# Patient Record
Sex: Male | Born: 1980 | Race: White | Hispanic: No | Marital: Single | Smoking: Current some day smoker
Health system: Southern US, Community
[De-identification: ages and names within clinical notes are randomized; demographics above are authoritative.]

## PROBLEM LIST (undated history)

## (undated) DIAGNOSIS — M1990 Unspecified osteoarthritis, unspecified site: Secondary | ICD-10-CM

## (undated) DIAGNOSIS — I10 Essential (primary) hypertension: Secondary | ICD-10-CM

## (undated) DIAGNOSIS — E785 Hyperlipidemia, unspecified: Secondary | ICD-10-CM

## (undated) DIAGNOSIS — K219 Gastro-esophageal reflux disease without esophagitis: Secondary | ICD-10-CM

## (undated) HISTORY — DX: Hyperlipidemia, unspecified: E78.5

## (undated) HISTORY — DX: Gastro-esophageal reflux disease without esophagitis: K21.9

## (undated) HISTORY — PX: TONSILLECTOMY: SUR1361

## (undated) SURGERY — Surgical Case
Anesthesia: *Unknown

---

## 2013-09-15 ENCOUNTER — Ambulatory Visit: Payer: Self-pay

## 2013-09-15 LAB — CBC WITH DIFFERENTIAL/PLATELET
Basophil #: 0 10*3/uL (ref 0.0–0.1)
Basophil %: 0.4 %
EOS ABS: 0.1 10*3/uL (ref 0.0–0.7)
Eosinophil %: 1.2 %
HCT: 44.9 % (ref 40.0–52.0)
HGB: 14.9 g/dL (ref 13.0–18.0)
LYMPHS ABS: 2.1 10*3/uL (ref 1.0–3.6)
Lymphocyte %: 26.8 %
MCH: 28.3 pg (ref 26.0–34.0)
MCHC: 33.1 g/dL (ref 32.0–36.0)
MCV: 86 fL (ref 80–100)
MONOS PCT: 6.6 %
Monocyte #: 0.5 x10 3/mm (ref 0.2–1.0)
Neutrophil #: 5.2 10*3/uL (ref 1.4–6.5)
Neutrophil %: 65 %
Platelet: 285 10*3/uL (ref 150–440)
RBC: 5.25 10*6/uL (ref 4.40–5.90)
RDW: 13 % (ref 11.5–14.5)
WBC: 8 10*3/uL (ref 3.8–10.6)

## 2013-09-15 LAB — URINALYSIS, COMPLETE
BILIRUBIN, UR: NEGATIVE
Bacteria: NEGATIVE
GLUCOSE, UR: NEGATIVE mg/dL (ref 0–75)
Ketone: NEGATIVE
Leukocyte Esterase: NEGATIVE
Nitrite: NEGATIVE
PH: 6.5 (ref 4.5–8.0)
PROTEIN: NEGATIVE
SPECIFIC GRAVITY: 1.01 (ref 1.003–1.030)
SQUAMOUS EPITHELIAL: NONE SEEN
WBC UR: NONE SEEN /HPF (ref 0–5)

## 2013-09-15 LAB — BASIC METABOLIC PANEL
Anion Gap: 10 (ref 7–16)
BUN: 9 mg/dL (ref 7–18)
CALCIUM: 9.4 mg/dL (ref 8.5–10.1)
CHLORIDE: 101 mmol/L (ref 98–107)
CREATININE: 0.97 mg/dL (ref 0.60–1.30)
Co2: 30 mmol/L (ref 21–32)
EGFR (African American): >60
EGFR (Non-African Amer.): >60
Glucose: 98 mg/dL (ref 65–99)
Osmolality: 280 (ref 275–301)
Potassium: 3.7 mmol/L (ref 3.5–5.1)
Sodium: 141 mmol/L (ref 136–145)

## 2013-09-17 LAB — URINE CULTURE

## 2015-03-05 ENCOUNTER — Other Ambulatory Visit: Payer: Self-pay | Admitting: Student

## 2015-03-05 DIAGNOSIS — R9389 Abnormal findings on diagnostic imaging of other specified body structures: Secondary | ICD-10-CM

## 2015-03-05 DIAGNOSIS — R1084 Generalized abdominal pain: Secondary | ICD-10-CM

## 2015-03-12 ENCOUNTER — Ambulatory Visit
Admission: RE | Admit: 2015-03-12 | Discharge: 2015-03-12 | Disposition: A | Discharge status: Home / Self Care | Payer: 59 | Source: Ambulatory Visit | Attending: Student | Admitting: Student

## 2015-03-12 DIAGNOSIS — R1084 Generalized abdominal pain: Secondary | ICD-10-CM | POA: Diagnosis present

## 2015-03-12 DIAGNOSIS — R938 Abnormal findings on diagnostic imaging of other specified body structures: Secondary | ICD-10-CM | POA: Insufficient documentation

## 2015-03-12 DIAGNOSIS — R9389 Abnormal findings on diagnostic imaging of other specified body structures: Secondary | ICD-10-CM

## 2015-03-12 HISTORY — DX: Essential (primary) hypertension: I10

## 2015-03-12 MED ORDER — IOHEXOL 300 MG/ML  SOLN
100.0000 mL | Freq: Once | INTRAMUSCULAR | Status: AC | PRN
  Administered 2015-03-12: 100 mL via INTRAVENOUS

## 2015-03-18 ENCOUNTER — Encounter: Payer: Self-pay | Admitting: Urology

## 2015-03-18 ENCOUNTER — Ambulatory Visit (INDEPENDENT_AMBULATORY_CARE_PROVIDER_SITE_OTHER): Payer: 59 | Admitting: Urology

## 2015-03-18 VITALS — BP 127/78 | HR 71 | Ht 71.0 in | Wt 251.5 lb

## 2015-03-18 DIAGNOSIS — N410 Acute prostatitis: Secondary | ICD-10-CM | POA: Diagnosis not present

## 2015-03-18 DIAGNOSIS — R350 Frequency of micturition: Secondary | ICD-10-CM | POA: Diagnosis not present

## 2015-03-18 DIAGNOSIS — E785 Hyperlipidemia, unspecified: Secondary | ICD-10-CM | POA: Insufficient documentation

## 2015-03-18 LAB — URINALYSIS, COMPLETE
Bilirubin, UA: NEGATIVE
GLUCOSE, UA: NEGATIVE
KETONES UA: NEGATIVE
Leukocytes, UA: NEGATIVE
NITRITE UA: NEGATIVE
Protein, UA: NEGATIVE
SPEC GRAV UA: 1.01 (ref 1.005–1.030)
Urobilinogen, Ur: 0.2 mg/dL (ref 0.2–1.0)
pH, UA: 7 (ref 5.0–7.5)

## 2015-03-18 LAB — MICROSCOPIC EXAMINATION
Bacteria, UA: NONE SEEN
EPITHELIAL CELLS (NON RENAL): NONE SEEN /HPF (ref 0–10)
RBC MICROSCOPIC, UA: NONE SEEN /HPF (ref 0–?)
RENAL EPITHEL UA: NONE SEEN /HPF
WBC UA: NONE SEEN /HPF (ref 0–?)

## 2015-03-18 LAB — BLADDER SCAN AMB NON-IMAGING

## 2015-03-18 MED ORDER — CIPROFLOXACIN HCL 500 MG PO TABS: 500.0000 mg | ORAL_TABLET | Freq: Two times a day (BID) | ORAL | Status: DC

## 2015-03-18 NOTE — Progress Notes (Signed)
03/18/2015 11:03 AM   Mark Rollins 02-28-1981 [id number]  Referring provider: [name], MD [address] [facility] [place], [place] [address]  Chief Complaint  Patient presents with  . Urinary Frequency    New Patient    HPI: The patient is a 34 year old gentleman presents for urinary frequency. He notes significant frequency and urgency. He has nocturia 2. This has been going on for 6-12 months. He believes this started suddenly. He has never had an issue with this before. He has had multiple sexual partners in the last 6-12 months. His urinalysis from today's clean. He has not been treated for urinary tract infection   PMH: Past Medical History  Diagnosis Date  . Hypertension     Surgical History: No past surgical history on file.  Home Medications:    Medication List       This list is accurate as of: 03/18/15 11:03 AM.  Always use your most recent med list.               DHA-EPA-VITAMIN E PO  Take by mouth.     meloxicam 15 MG tablet  Commonly known as:  MOBIC  TAKE 1 TABLET (15 MG TOTAL) BY MOUTH ONCE DAILY.     multivitamin capsule  Take 1 capsule by mouth daily.     simvastatin 20 MG tablet  Commonly known as:  ZOCOR  TAKE 1 TABLET (20 MG TOTAL) BY MOUTH NIGHTLY.        Allergies: No Known Allergies  Family History: No family history on file.  Social History:  has no tobacco, alcohol, and drug history on file.  ROS:                                        Physical Exam: There were no vitals taken for this visit.  Constitutional:  Alert and oriented, No acute distress. HEENT: [place] AT, moist mucus membranes.  Trachea midline, no masses. Cardiovascular: No clubbing, cyanosis, or edema. Respiratory: Normal respiratory effort, no increased work of breathing. GI: Abdomen is soft, nontender, nondistended, no abdominal masses GU: No CVA tenderness. Normal phallus. Testicles descended equal bilaterally. No  masses. The patient is very tender on digital rectal exam. His prostate did seem somewhat boggy to me. Skin: No rashes, bruises or suspicious lesions. Lymph: No cervical or inguinal adenopathy. Neurologic: Grossly intact, no focal deficits, moving all 4 extremities. Psychiatric: Normal mood and affect.  Laboratory Data: Lab Results  Component Value Date   WBC 8.0 09/15/2013   HGB 14.9 09/15/2013   HCT 44.9 09/15/2013   MCV 86 09/15/2013   PLT 285 09/15/2013    Lab Results  Component Value Date   CREATININE 0.97 09/15/2013    No results found for: PSA  No results found for: TESTOSTERONE  No results found for: HGBA1C  Urinalysis    Component Value Date/Time   COLORURINE YELLOW 09/15/2013 1330   APPEARANCEUR CLEAR 09/15/2013 1330   LABSPEC 1.010 09/15/2013 1330   PHURINE 6.5 09/15/2013 1330   GLUCOSEU NEGATIVE 09/15/2013 1330   HGBUR 1+ 09/15/2013 1330   BILIRUBINUR NEGATIVE 09/15/2013 1330   KETONESUR NEGATIVE 09/15/2013 1330   PROTEINUR NEGATIVE 09/15/2013 1330   NITRITE NEGATIVE 09/15/2013 1330   LEUKOCYTESUR NEGATIVE 09/15/2013 1330    Pertinent Imaging: PVR: 25  cc  Assessment & Plan:    1. Prostatitis Times concerned the patient  may have prostatitis based on my exam. Though his urine is clear today on urinalysis. We will treat him prophylactically with a month's course of Cipro 500 twice a day. He will follow-up in 4-6 weeks. At that time, we will evaluate his symptoms. He may benefit from a cystoscopy at that time because he had a complaints of post void dribbling and some changes in his stream. He is encouraged to follow up sooner if his symptoms worsen. We will also send a urine culture, GC, chlamydia and Trichomonas   No Follow-up on file.  Hildred LaserBrian James Arlind Klingerman, MD  [facility] [address], [address] [place], [place] [address] [phone]

## 2015-03-19 LAB — CHLAMYDIA/GONOCOCCUS/TRICHOMONAS, NAA
CHLAMYDIA BY NAA: NEGATIVE
GONOCOCCUS BY NAA: NEGATIVE
Trich vag by NAA: NEGATIVE

## 2015-03-20 LAB — CULTURE, URINE COMPREHENSIVE

## 2015-04-22 ENCOUNTER — Ambulatory Visit (INDEPENDENT_AMBULATORY_CARE_PROVIDER_SITE_OTHER): Payer: 59 | Admitting: Urology

## 2015-04-22 ENCOUNTER — Encounter: Payer: Self-pay | Admitting: Urology

## 2015-04-22 VITALS — BP 113/67 | HR 63 | Ht 71.0 in | Wt 247.2 lb

## 2015-04-22 DIAGNOSIS — N411 Chronic prostatitis: Secondary | ICD-10-CM

## 2015-04-22 DIAGNOSIS — N3281 Overactive bladder: Secondary | ICD-10-CM | POA: Diagnosis not present

## 2015-04-22 LAB — URINALYSIS, COMPLETE
Bilirubin, UA: NEGATIVE
Glucose, UA: NEGATIVE
Ketones, UA: NEGATIVE
Leukocytes, UA: NEGATIVE
NITRITE UA: NEGATIVE
PH UA: 6.5 (ref 5.0–7.5)
Protein, UA: NEGATIVE
Specific Gravity, UA: 1.02 (ref 1.005–1.030)
UUROB: 0.2 mg/dL (ref 0.2–1.0)

## 2015-04-22 LAB — MICROSCOPIC EXAMINATION
BACTERIA UA: NONE SEEN
EPITHELIAL CELLS (NON RENAL): NONE SEEN /HPF (ref 0–10)

## 2015-04-22 MED ORDER — MIRABEGRON ER 25 MG PO TB24: 25.0000 mg | ORAL_TABLET | Freq: Every day | ORAL | Status: DC

## 2015-04-22 NOTE — Progress Notes (Signed)
04/22/2015 8:54 AM   Mark Rollins 1980/10/10 [id number]  Referring provider: [name], MD [address] [facility] [place], [place] [address]  Chief Complaint  Patient presents with  . Follow-up    prostatits    HPI: The patient is a 34 year old gentleman presents for urinary frequency. He notes significant frequency and urgency. He has nocturia 2. This has been going on for 6-12 months. He believes this started suddenly. He has never had an issue with this before. He has had multiple sexual partners in the last 6-12 months. His urinalysis from today's clean. He has not been treated for urinary tract infection  Interval history: The patient was started on ciprofloxacin last month. He was only minimal change in symptoms. This is nocturia 2-3. He also has frequency every 2 hours and urgency. He finds the urgency very bothersome.   PMH: Past Medical History  Diagnosis Date  . Hypertension   . Hyperlipemia   . GERD (gastroesophageal reflux disease)     Surgical History: Past Surgical History  Procedure Laterality Date  . Tonsillectomy      Home Medications:    Medication List       This list is accurate as of: 04/22/15  8:54 AM.  Always use your most recent med list.               ciprofloxacin 500 MG tablet  Commonly known as:  CIPRO  Take 1 tablet (500 mg total) by mouth every 12 (twelve) hours.     DHA-EPA-VITAMIN E PO  Take by mouth.     meloxicam 15 MG tablet  Commonly known as:  MOBIC  TAKE 1 TABLET (15 MG TOTAL) BY MOUTH ONCE DAILY.     mirabegron ER 25 MG Tb24 tablet  Commonly known as:  MYRBETRIQ  Take 1 tablet (25 mg total) by mouth daily.     multivitamin capsule  Take 1 capsule by mouth daily.     simvastatin 20 MG tablet  Commonly known as:  ZOCOR  TAKE 1 TABLET (20 MG TOTAL) BY MOUTH NIGHTLY.        Allergies: No Known Allergies  Family History: Family History  Problem Relation Age of Onset  . Prostate cancer Neg  Hx   . Kidney cancer Neg Hx   . Bladder Cancer Neg Hx     Social History:  reports that he has never smoked. He does not have any smokeless tobacco history on file. He reports that he drinks alcohol. He reports that he does not use illicit drugs.  ROS: UROLOGY Frequent Urination?: Yes Hard to postpone urination?: Yes Burning/pain with urination?: No Get up at night to urinate?: Yes Leakage of urine?: No Urine stream starts and stops?: No Trouble starting stream?: No Do you have to strain to urinate?: No Blood in urine?: Yes Urinary tract infection?: No Sexually transmitted disease?: No Injury to kidneys or bladder?: No Painful intercourse?: No Weak stream?: No Erection problems?: No Penile pain?: No  Gastrointestinal Nausea?: No Vomiting?: No Indigestion/heartburn?: Yes Diarrhea?: No Constipation?: No  Constitutional Fever: No Night sweats?: No Weight loss?: No Fatigue?: No  Skin Skin rash/lesions?: No Itching?: No  Eyes Blurred vision?: No Double vision?: No  Ears/Nose/Throat Sore throat?: No Sinus problems?: No  Hematologic/Lymphatic Swollen glands?: No Easy bruising?: No  Cardiovascular Leg swelling?: No Chest pain?: No  Respiratory Cough?: No Shortness of breath?: No  Endocrine Excessive thirst?: No  Musculoskeletal Back pain?: No Joint pain?: No  Neurological  Headaches?: No Dizziness?: No  Psychologic Depression?: No Anxiety?: No  Physical Exam: BP 113/67 mmHg  Pulse 63  Ht 5\' 11"  (1.803 m)  Wt 247 lb 3.2 oz (112.129 kg)  BMI 34.49 kg/m2  Constitutional:  Alert and oriented, No acute distress. HEENT: [place] AT, moist mucus membranes.  Trachea midline, no masses. Cardiovascular: No clubbing, cyanosis, or edema. Respiratory: Normal respiratory effort, no increased work of breathing. GI: Abdomen is soft, nontender, nondistended, no abdominal masses GU: No CVA tenderness. Skin: No rashes, bruises or suspicious lesions. Lymph: No  cervical or inguinal adenopathy. Neurologic: Grossly intact, no focal deficits, moving all 4 extremities. Psychiatric: Normal mood and affect.  Laboratory Data: Lab Results  Component Value Date   WBC 8.0 09/15/2013   HGB 14.9 09/15/2013   HCT 44.9 09/15/2013   MCV 86 09/15/2013   PLT 285 09/15/2013    Lab Results  Component Value Date   CREATININE 0.97 09/15/2013    No results found for: PSA  No results found for: TESTOSTERONE  No results found for: HGBA1C  Urinalysis    Component Value Date/Time   COLORURINE YELLOW 09/15/2013 1330   APPEARANCEUR CLEAR 09/15/2013 1330   LABSPEC 1.010 09/15/2013 1330   PHURINE 6.5 09/15/2013 1330   GLUCOSEU Negative 03/18/2015 1105   GLUCOSEU NEGATIVE 09/15/2013 1330   HGBUR 1+ 09/15/2013 1330   BILIRUBINUR Negative 03/18/2015 1105   BILIRUBINUR NEGATIVE 09/15/2013 1330   KETONESUR NEGATIVE 09/15/2013 1330   PROTEINUR NEGATIVE 09/15/2013 1330   NITRITE Negative 03/18/2015 1105   NITRITE NEGATIVE 09/15/2013 1330   LEUKOCYTESUR Negative 03/18/2015 1105   LEUKOCYTESUR NEGATIVE 09/15/2013 1330     Assessment & Plan:    The patient seems to have an overactive bladder. He failed treatment for chronic prostatitis and has not had any positive urine cultures. At this time, we will treat him for overactive bladder. I gave him samples of Myrbetriq 25 mg to take daily. I also called in a prescription to his pharmacy. If his insurance will not pay for this medication, we will switch him to an anticholinergic.   1. Overactive bladder -Myrbetriq 25 mg daily   Hildred LaserBrian James Iyah Laguna, MD  [facility] [address], [address] [place], [place] [address] [phone]

## 2015-05-25 ENCOUNTER — Telehealth: Payer: Self-pay

## 2015-05-25 NOTE — Telephone Encounter (Signed)
Pt insurance co will not pay for medication. Please advise.

## 2015-05-26 ENCOUNTER — Ambulatory Visit: Payer: 59

## 2015-05-27 NOTE — Telephone Encounter (Signed)
Can we order him vesicare 5 mg daily? thanks

## 2015-06-03 ENCOUNTER — Ambulatory Visit (INDEPENDENT_AMBULATORY_CARE_PROVIDER_SITE_OTHER): Payer: 59 | Admitting: Urology

## 2015-06-03 VITALS — Ht 71.0 in | Wt 243.4 lb

## 2015-06-03 DIAGNOSIS — N3281 Overactive bladder: Secondary | ICD-10-CM

## 2015-06-03 LAB — URINALYSIS, COMPLETE
BILIRUBIN UA: NEGATIVE
Glucose, UA: NEGATIVE
Ketones, UA: NEGATIVE
LEUKOCYTES UA: NEGATIVE
Nitrite, UA: NEGATIVE
PH UA: 7 (ref 5.0–7.5)
PROTEIN UA: NEGATIVE
Specific Gravity, UA: 1.015 (ref 1.005–1.030)
UUROB: 0.2 mg/dL (ref 0.2–1.0)

## 2015-06-03 LAB — MICROSCOPIC EXAMINATION: WBC UA: NONE SEEN /HPF (ref 0–?)

## 2015-06-03 NOTE — Progress Notes (Signed)
06/03/2015 8:56 AM   Mark Rollins 1981-03-23 [id number]  Referring provider: Jerl MinaJames Hedrick, MD [address] [facility] [place], [place] [address]  Chief Complaint  Patient presents with  . Follow-up    OAB, prostatitis    HPI: The patient is a 35 year old gentleman presents for urinary frequency. He notes significant frequency and urgency. He has nocturia 2. This has been going on for 6-12 months. He believes this started suddenly. He has never had an issue with this before. He has had multiple sexual partners in the last 6-12 months. His urinalysis from today's clean. He has not been treated for urinary tract infection  December 2016 Interval history: The patient was started on ciprofloxacin last month. He was only minimal change in symptoms. This is nocturia 2-3. He also has frequency every 2 hours and urgency. He finds the urgency very bothersome.     January 2016 Interval History: The patient is been very happy since starting Myrbetriq. He is able to sleep through the night without nocturia. He is gone from voiding approximately once every hour to once every 3-4 hours. His urgency is also resolved. His insurance would not cover this medication, so he was only taking it every other day to make his samples last. He says even take it every other day successfully manage his symptoms.  PMH: Past Medical History  Diagnosis Date  . Hypertension   . Hyperlipemia   . GERD (gastroesophageal reflux disease)     Surgical History: Past Surgical History  Procedure Laterality Date  . Tonsillectomy      Home Medications:    Medication List       This list is accurate as of: 06/03/15  8:56 AM.  Always use your most recent med list.               Fish Oil 1000 MG Caps  Take 1 capsule by mouth.     glucosamine-chondroitin 500-400 MG tablet  Take 1 tablet by mouth 3 (three) times daily.     mirabegron ER 25 MG Tb24 tablet  Commonly known as:  MYRBETRIQ  Take 1  tablet (25 mg total) by mouth daily.     multivitamin capsule  Take 1 capsule by mouth daily.     simvastatin 20 MG tablet  Commonly known as:  ZOCOR  TAKE 1 TABLET (20 MG TOTAL) BY MOUTH NIGHTLY.        Allergies: No Known Allergies  Family History: Family History  Problem Relation Age of Onset  . Prostate cancer Neg Hx   . Kidney cancer Neg Hx   . Bladder Cancer Neg Hx     Social History:  reports that he has never smoked. He does not have any smokeless tobacco history on file. He reports that he drinks alcohol. He reports that he does not use illicit drugs.  ROS: UROLOGY Frequent Urination?: Yes Hard to postpone urination?: Yes Burning/pain with urination?: No Get up at night to urinate?: Yes Leakage of urine?: No Urine stream starts and stops?: No Trouble starting stream?: No Do you have to strain to urinate?: No Blood in urine?: No Urinary tract infection?: No Sexually transmitted disease?: No Injury to kidneys or bladder?: No Painful intercourse?: No Weak stream?: No Erection problems?: No Penile pain?: No  Gastrointestinal Nausea?: No Vomiting?: No Indigestion/heartburn?: No Diarrhea?: No Constipation?: No  Constitutional Fever: No Night sweats?: No Weight loss?: No Fatigue?: No  Skin Skin rash/lesions?: No Itching?: No  Eyes Blurred vision?:  No Double vision?: No  Ears/Nose/Throat Sore throat?: No Sinus problems?: No  Hematologic/Lymphatic Swollen glands?: No Easy bruising?: No  Cardiovascular Leg swelling?: No Chest pain?: No  Respiratory Cough?: No Shortness of breath?: No  Endocrine Excessive thirst?: No  Musculoskeletal Back pain?: No Joint pain?: No  Neurological Headaches?: No Dizziness?: No  Psychologic Depression?: No Anxiety?: No  Physical Exam: Ht 5\' 11"  (1.803 m)  Wt 243 lb 6.4 oz (110.406 kg)  BMI 33.96 kg/m2  Constitutional:  Alert and oriented, No acute distress. HEENT: [place] AT, moist mucus  membranes.  Trachea midline, no masses. Cardiovascular: No clubbing, cyanosis, or edema. Respiratory: Normal respiratory effort, no increased work of breathing. GI: Abdomen is soft, nontender, nondistended, no abdominal masses GU: No CVA tenderness.  Skin: No rashes, bruises or suspicious lesions. Lymph: No cervical or inguinal adenopathy. Neurologic: Grossly intact, no focal deficits, moving all 4 extremities. Psychiatric: Normal mood and affect.  Laboratory Data: Lab Results  Component Value Date   WBC 8.0 09/15/2013   HGB 14.9 09/15/2013   HCT 44.9 09/15/2013   MCV 86 09/15/2013   PLT 285 09/15/2013    Lab Results  Component Value Date   CREATININE 0.97 09/15/2013    No results found for: PSA  No results found for: TESTOSTERONE  No results found for: HGBA1C  Urinalysis    Component Value Date/Time   COLORURINE YELLOW 09/15/2013 1330   APPEARANCEUR CLEAR 09/15/2013 1330   LABSPEC 1.010 09/15/2013 1330   PHURINE 6.5 09/15/2013 1330   GLUCOSEU Negative 04/22/2015 0834   GLUCOSEU NEGATIVE 09/15/2013 1330   HGBUR 1+ 09/15/2013 1330   BILIRUBINUR Negative 04/22/2015 0834   BILIRUBINUR NEGATIVE 09/15/2013 1330   KETONESUR NEGATIVE 09/15/2013 1330   PROTEINUR NEGATIVE 09/15/2013 1330   NITRITE Negative 04/22/2015 0834   NITRITE NEGATIVE 09/15/2013 1330   LEUKOCYTESUR Negative 04/22/2015 0834   LEUKOCYTESUR NEGATIVE 09/15/2013 1330     Assessment & Plan:    The patient is well controlled on Myrbetriq, however his insurance does not cover this medication. We've arranged with the drug rep for the patient to receive free medication. He will pick this up from our office when it is available. He was given a 28 day course of samples until that time.  1. Overactive bladder -Continue Myrbetriq 25 mg -Follow-up in 3 months   Return in about 3 months (around 09/01/2015).  Hildred Laser, MD  [facility] [address], [address] [place], [place] [address] [phone]

## 2015-09-01 ENCOUNTER — Encounter: Payer: Self-pay | Admitting: Urology

## 2015-09-01 ENCOUNTER — Ambulatory Visit (INDEPENDENT_AMBULATORY_CARE_PROVIDER_SITE_OTHER): Payer: 59 | Admitting: Urology

## 2015-09-01 VITALS — BP 121/79 | HR 61 | Ht 70.0 in | Wt 239.3 lb

## 2015-09-01 DIAGNOSIS — N3281 Overactive bladder: Secondary | ICD-10-CM

## 2015-09-01 LAB — URINALYSIS, COMPLETE
BILIRUBIN UA: NEGATIVE
GLUCOSE, UA: NEGATIVE
KETONES UA: NEGATIVE
Leukocytes, UA: NEGATIVE
Nitrite, UA: NEGATIVE
PH UA: 7 (ref 5.0–7.5)
PROTEIN UA: NEGATIVE
Specific Gravity, UA: 1.02 (ref 1.005–1.030)
UUROB: 0.2 mg/dL (ref 0.2–1.0)

## 2015-09-01 LAB — MICROSCOPIC EXAMINATION
BACTERIA UA: NONE SEEN
Epithelial Cells (non renal): NONE SEEN /hpf (ref 0–10)

## 2015-09-01 NOTE — Progress Notes (Signed)
09/01/2015 9:04 AM   Philipp Brannick 10-12-1980 [id number]  Referring provider: [name], MD [address] [facility] [place], [place] [address]  Chief Complaint  Patient presents with  . Follow-up    OAB    HPI: The patient is a 35 year old gentleman With a history of overactive bladder who is on Mybetriq who presents for follow up. Prior to starting treatment, the patient's severe urgency and frequency. He also nocturia 2. He was initially started on antibiotics due to concern for urinary tract infection. His symptoms did not resolve. He was started on Myrbetriq 25 mg daily. He decreased interval to once every other day or third day. Symptoms significantly improved. He is no as nocturia. His urgency has resolved.    PMH: Past Medical History  Diagnosis Date  . Hypertension   . Hyperlipemia   . GERD (gastroesophageal reflux disease)     Surgical History: Past Surgical History  Procedure Laterality Date  . Tonsillectomy      Home Medications:    Medication List       This list is accurate as of: 09/01/15  9:04 AM.  Always use your most recent med list.               Fish Oil 1000 MG Caps  Take 1 capsule by mouth.     OMEGA-3 FATTY ACIDS PO  Take by mouth. Reported on 09/01/2015     glucosamine-chondroitin 500-400 MG tablet  Take 1 tablet by mouth 3 (three) times daily.     mirabegron ER 25 MG Tb24 tablet  Commonly known as:  MYRBETRIQ  Take 1 tablet (25 mg total) by mouth daily.     multivitamin capsule  Take 1 capsule by mouth daily.     simvastatin 20 MG tablet  Commonly known as:  ZOCOR  TAKE 1 TABLET (20 MG TOTAL) BY MOUTH NIGHTLY.        Allergies: No Known Allergies  Family History: Family History  Problem Relation Age of Onset  . Prostate cancer Neg Hx   . Kidney cancer Neg Hx   . Bladder Cancer Neg Hx     Social History:  reports that he has never smoked. He does not have any smokeless tobacco history on file. He  reports that he drinks alcohol. He reports that he does not use illicit drugs.  ROS: UROLOGY Frequent Urination?: No Hard to postpone urination?: No Burning/pain with urination?: No Get up at night to urinate?: Yes Leakage of urine?: No Urine stream starts and stops?: No Trouble starting stream?: No Do you have to strain to urinate?: No Blood in urine?: No Urinary tract infection?: No Sexually transmitted disease?: No Injury to kidneys or bladder?: No Painful intercourse?: No Weak stream?: No Erection problems?: No Penile pain?: No  Gastrointestinal Nausea?: No Vomiting?: No Indigestion/heartburn?: No Diarrhea?: No Constipation?: No  Constitutional Fever: No Night sweats?: No Weight loss?: No Fatigue?: No  Skin Skin rash/lesions?: No Itching?: No  Eyes Blurred vision?: No Double vision?: No  Ears/Nose/Throat Sore throat?: No Sinus problems?: No  Hematologic/Lymphatic Swollen glands?: No Easy bruising?: No  Cardiovascular Leg swelling?: No Chest pain?: No  Respiratory Cough?: No Shortness of breath?: No  Endocrine Excessive thirst?: No  Musculoskeletal Back pain?: No Joint pain?: No  Neurological Headaches?: No Dizziness?: No  Psychologic Depression?: No Anxiety?: No  Physical Exam: BP 121/79 mmHg  Pulse 61  Ht  (1.778 m)  Wt 239 lb 4.8 oz (108.546 kg)  BMI 34.34 kg/m2  Constitutional:  Alert and oriented, No acute distress. HEENT: [place] AT, moist mucus membranes.  Trachea midline, no masses. Cardiovascular: No clubbing, cyanosis, or edema. Respiratory: Normal respiratory effort, no increased work of breathing. GI: Abdomen is soft, nontender, nondistended, no abdominal masses GU: No CVA tenderness.  Skin: No rashes, bruises or suspicious lesions. Lymph: No cervical or inguinal adenopathy. Neurologic: Grossly intact, no focal deficits, moving all 4 extremities. Psychiatric: Normal mood and affect.  Laboratory Data: Lab  Results  Component Value Date   WBC 8.0 09/15/2013   HGB 14.9 09/15/2013   HCT 44.9 09/15/2013   MCV 86 09/15/2013   PLT 285 09/15/2013    Lab Results  Component Value Date   CREATININE 0.97 09/15/2013    No results found for: PSA  No results found for: TESTOSTERONE  No results found for: HGBA1C  Urinalysis    Component Value Date/Time   COLORURINE YELLOW 09/15/2013 1330   APPEARANCEUR Clear 06/03/2015 0851   APPEARANCEUR CLEAR 09/15/2013 1330   LABSPEC 1.010 09/15/2013 1330   PHURINE 6.5 09/15/2013 1330   GLUCOSEU Negative 06/03/2015 0851   GLUCOSEU NEGATIVE 09/15/2013 1330   HGBUR 1+ 09/15/2013 1330   BILIRUBINUR Negative 06/03/2015 0851   BILIRUBINUR NEGATIVE 09/15/2013 1330   KETONESUR NEGATIVE 09/15/2013 1330   PROTEINUR Negative 06/03/2015 0851   PROTEINUR NEGATIVE 09/15/2013 1330   NITRITE Negative 06/03/2015 0851   NITRITE NEGATIVE 09/15/2013 1330   LEUKOCYTESUR Negative 06/03/2015 0851   LEUKOCYTESUR NEGATIVE 09/15/2013 1330    Assessment & Plan:   1. Overactive bladder -Continue Myrbetriq 25 mg. Will continue to provide the patient with samples since his insurance will not cover this medication -Follow-up in 12 months   Return in about 1 year (around 08/31/2016).  Hildred LaserBrian James Danyel Tobey, MD  [facility] [address], [address] [place], [place] [address] [phone]

## 2016-08-31 ENCOUNTER — Ambulatory Visit: Payer: 59

## 2016-09-14 ENCOUNTER — Ambulatory Visit: Payer: 59

## 2016-09-25 ENCOUNTER — Other Ambulatory Visit
Admission: RE | Admit: 2016-09-25 | Discharge: 2016-09-25 | Disposition: A | Discharge status: Home / Self Care | Payer: 59 | Source: Ambulatory Visit | Attending: Internal Medicine | Admitting: Internal Medicine

## 2016-09-25 DIAGNOSIS — M25462 Effusion, left knee: Secondary | ICD-10-CM | POA: Insufficient documentation

## 2016-09-25 LAB — SYNOVIAL CELL COUNT + DIFF, W/ CRYSTALS
CRYSTALS FLUID: NONE SEEN
Eosinophils-Synovial: 0 %
Lymphocytes-Synovial Fld: 39 %
Monocyte-Macrophage-Synovial Fluid: 58 %
Neutrophil, Synovial: 3 %
WBC, Synovial: 5799 /mm3 — ABNORMAL HIGH (ref 0–200)

## 2016-09-26 ENCOUNTER — Other Ambulatory Visit
Admission: RE | Admit: 2016-09-26 | Discharge: 2016-09-26 | Disposition: A | Discharge status: Home / Self Care | Payer: 59 | Source: Ambulatory Visit | Attending: Internal Medicine | Admitting: Internal Medicine

## 2016-09-26 DIAGNOSIS — M25462 Effusion, left knee: Secondary | ICD-10-CM | POA: Diagnosis not present

## 2016-09-29 LAB — BODY FLUID CULTURE: Culture: NO GROWTH

## 2016-10-05 ENCOUNTER — Ambulatory Visit (INDEPENDENT_AMBULATORY_CARE_PROVIDER_SITE_OTHER): Payer: 59 | Admitting: Urology

## 2016-10-05 ENCOUNTER — Encounter: Payer: Self-pay | Admitting: Urology

## 2016-10-05 VITALS — BP 125/61 | HR 61 | Ht 70.0 in | Wt 258.8 lb

## 2016-10-05 DIAGNOSIS — Z113 Encounter for screening for infections with a predominantly sexual mode of transmission: Secondary | ICD-10-CM | POA: Diagnosis not present

## 2016-10-05 DIAGNOSIS — N3281 Overactive bladder: Secondary | ICD-10-CM

## 2016-10-05 LAB — BLADDER SCAN AMB NON-IMAGING: Scan Result: 23

## 2016-10-05 MED ORDER — SOLIFENACIN SUCCINATE 5 MG PO TABS: 5.0000 mg | ORAL_TABLET | Freq: Every day | ORAL | 11 refills | Status: DC

## 2016-10-05 NOTE — Progress Notes (Signed)
10/05/2016 10:26 AM   Kayla Santosuosso [birth year] [id number]  Referring provider: [name], MD [address] [facility] [place], [place] [address]  Chief Complaint  Patient presents with  . Over Active Bladder    HPI: The patient is a 36 year old gentleman with a past medical history of an overactive bladder who is on Myrbetriq 25 mg daily presents today for annual follow-up. Prior to starting this therapy approximately one year ago, he was having severe urgency and frequency. He was also having nocturia 2. The symptoms completely resolve with this medication. He ran out 6 months ago because his insurance will not cover it. His symptoms return. Interested in trying another medication. He is also requesting STD screening today.   PMH: Past Medical History:  Diagnosis Date  . GERD (gastroesophageal reflux disease)   . Hyperlipemia   . Hypertension     Surgical History: Past Surgical History:  Procedure Laterality Date  . TONSILLECTOMY      Home Medications:  Allergies as of 10/05/2016   No Known Allergies     Medication List       Accurate as of 10/05/16 10:26 AM. Always use your most recent med list.          Fish Oil 1000 MG Caps Take 1 capsule by mouth.   glucosamine-chondroitin 500-400 MG tablet Take 1 tablet by mouth 3 (three) times daily.   mirabegron ER 25 MG Tb24 tablet Commonly known as:  MYRBETRIQ Take 1 tablet (25 mg total) by mouth daily.   multivitamin capsule Take 1 capsule by mouth daily.   simvastatin 20 MG tablet Commonly known as:  ZOCOR TAKE 1 TABLET (20 MG TOTAL) BY MOUTH NIGHTLY.   solifenacin 5 MG tablet Commonly known as:  VESICARE Take 1 tablet (5 mg total) by mouth daily.       Allergies: No Known Allergies  Family History: Family History  Problem Relation Age of Onset  . Prostate cancer Neg Hx   . Kidney cancer Neg Hx   . Bladder Cancer Neg Hx     Social History:  reports that he has never smoked. He  has never used smokeless tobacco. He reports that he drinks alcohol. He reports that he does not use drugs.  ROS: UROLOGY Frequent Urination?: Yes Hard to postpone urination?: No Burning/pain with urination?: No Get up at night to urinate?: No Leakage of urine?: No Urine stream starts and stops?: No Trouble starting stream?: No Do you have to strain to urinate?: No Blood in urine?: No Urinary tract infection?: No Sexually transmitted disease?: No Injury to kidneys or bladder?: No Painful intercourse?: No Weak stream?: No Erection problems?: No Penile pain?: No  Gastrointestinal Nausea?: No Vomiting?: No Indigestion/heartburn?: No Diarrhea?: No Constipation?: No  Constitutional Fever: No Night sweats?: No Weight loss?: No Fatigue?: No  Skin Skin rash/lesions?: No Itching?: No  Eyes Blurred vision?: No Double vision?: No  Ears/Nose/Throat Sore throat?: No Sinus problems?: No  Hematologic/Lymphatic Swollen glands?: No Easy bruising?: No  Cardiovascular Leg swelling?: No Chest pain?: No  Respiratory Cough?: No Shortness of breath?: No  Endocrine Excessive thirst?: No  Musculoskeletal Back pain?: No Joint pain?: No  Neurological Headaches?: No Dizziness?: No  Psychologic Depression?: No Anxiety?: No  Physical Exam: BP 125/61 (BP Location: Left Arm, Patient Position: Sitting, Cuff Size: Normal)   Pulse 61   Ht 5\' 10"  (1.778 m)   Wt 258 lb 12.8 oz (117.4 kg)   BMI 37.13 kg/m  Constitutional:  Alert and oriented, No acute distress. HEENT: [place] AT, moist mucus membranes.  Trachea midline, no masses. Cardiovascular: No clubbing, cyanosis, or edema. Respiratory: Normal respiratory effort, no increased work of breathing. GI: Abdomen is soft, nontender, nondistended, no abdominal masses GU: No CVA tenderness. Normal phallus. Testicles and equal bilaterally. Benign. No evidence of HPV or HSV lesions. Skin: No rashes, bruises or suspicious  lesions. Lymph: No cervical or inguinal adenopathy. Neurologic: Grossly intact, no focal deficits, moving all 4 extremities. Psychiatric: Normal mood and affect.  Laboratory Data: Lab Results  Component Value Date   WBC 8.0 09/15/2013   HGB 14.9 09/15/2013   HCT 44.9 09/15/2013   MCV 86 09/15/2013   PLT 285 09/15/2013    Lab Results  Component Value Date   CREATININE 0.97 09/15/2013    No results found for: PSA  No results found for: TESTOSTERONE  No results found for: HGBA1C  Urinalysis    Component Value Date/Time   COLORURINE YELLOW 09/15/2013 1330   APPEARANCEUR Clear 09/01/2015 0846   LABSPEC 1.010 09/15/2013 1330   PHURINE 6.5 09/15/2013 1330   GLUCOSEU Negative 09/01/2015 0846   GLUCOSEU NEGATIVE 09/15/2013 1330   HGBUR 1+ 09/15/2013 1330   BILIRUBINUR Negative 09/01/2015 0846   BILIRUBINUR NEGATIVE 09/15/2013 1330   KETONESUR NEGATIVE 09/15/2013 1330   PROTEINUR Negative 09/01/2015 0846   PROTEINUR NEGATIVE 09/15/2013 1330   NITRITE Negative 09/01/2015 0846   NITRITE NEGATIVE 09/15/2013 1330   LEUKOCYTESUR Negative 09/01/2015 0846   LEUKOCYTESUR NEGATIVE 09/15/2013 1330     Assessment & Plan:    1. OAB -We'll try the patient Vesicare 5 mg daily. If this medication is too expensive, we can try a different anticholinergic. Follow up annually  2. STD screening The patient is requesting ostomy screening. His exam is unremarkable. We'll send his urine for GC and chlamydia.  Return in about 1 year (around 10/05/2017).  Hildred LaserBrian James Meridee Branum, MD  [facility] [address], [address] [place], [place] [address] [phone]

## 2016-10-07 LAB — GC/CHLAMYDIA PROBE AMP
Chlamydia trachomatis, NAA: NEGATIVE
NEISSERIA GONORRHOEAE BY PCR: NEGATIVE

## 2016-10-12 ENCOUNTER — Telehealth: Payer: Self-pay

## 2016-10-12 NOTE — Telephone Encounter (Signed)
Spoke with pt in reference to lab results. Pt voiced understanding.  

## 2016-10-12 NOTE — Telephone Encounter (Signed)
Hildred LaserBudzyn, Brian James, MD  Skeet LatchWatkins, Indiah Heyden C, LPN        Please let patient know GC/chlamdia results were negative. No sign of STD.    LMOM

## 2016-10-18 ENCOUNTER — Telehealth: Payer: Self-pay

## 2016-10-18 NOTE — Telephone Encounter (Signed)
PA for vesicare has been DENIED. Pt is to try oxybutynin, tolterodine, or trospium.

## 2016-11-10 LAB — ACID FAST CULTURE WITH REFLEXED SENSITIVITIES (MYCOBACTERIA): Acid Fast Culture: NEGATIVE

## 2016-11-10 LAB — ACID FAST CULTURE WITH REFLEXED SENSITIVITIES

## 2017-01-11 DIAGNOSIS — E785 Hyperlipidemia, unspecified: Secondary | ICD-10-CM | POA: Diagnosis not present

## 2017-01-18 DIAGNOSIS — R945 Abnormal results of liver function studies: Secondary | ICD-10-CM | POA: Diagnosis not present

## 2017-01-18 DIAGNOSIS — N3281 Overactive bladder: Secondary | ICD-10-CM | POA: Diagnosis not present

## 2017-01-18 DIAGNOSIS — E785 Hyperlipidemia, unspecified: Secondary | ICD-10-CM | POA: Diagnosis not present

## 2017-02-14 DIAGNOSIS — E785 Hyperlipidemia, unspecified: Secondary | ICD-10-CM | POA: Diagnosis not present

## 2017-02-14 DIAGNOSIS — M25462 Effusion, left knee: Secondary | ICD-10-CM | POA: Diagnosis not present

## 2017-10-04 ENCOUNTER — Ambulatory Visit: Payer: 59

## 2017-11-08 ENCOUNTER — Ambulatory Visit: Payer: Self-pay

## 2017-11-26 ENCOUNTER — Encounter: Payer: Self-pay | Admitting: Urology

## 2017-11-26 ENCOUNTER — Ambulatory Visit (INDEPENDENT_AMBULATORY_CARE_PROVIDER_SITE_OTHER): Payer: 59 | Admitting: Urology

## 2017-11-26 VITALS — BP 156/79 | HR 80 | Wt 275.4 lb

## 2017-11-26 DIAGNOSIS — R351 Nocturia: Secondary | ICD-10-CM | POA: Diagnosis not present

## 2017-11-26 MED ORDER — OXYBUTYNIN CHLORIDE ER 10 MG PO TB24: 10.0000 mg | ORAL_TABLET | Freq: Every day | ORAL | 11 refills | Status: DC

## 2017-11-26 MED ORDER — TOLTERODINE TARTRATE ER 4 MG PO CP24: 4.0000 mg | ORAL_CAPSULE | Freq: Every day | ORAL | 11 refills | Status: DC

## 2017-11-26 NOTE — Progress Notes (Signed)
11/26/2017 2:59 PM   Mark Rollins 12-10-1980 [id number]  Referring provider: [name], MD [address] [facility] [place], [place] [address]  No chief complaint on file.   HPI: Dr [name]:  The patient is a 37 year old gentleman with a past medical history of an overactive bladder who is on Myrbetriq 25 mg daily presents today for annual follow-up. Prior to starting this therapy approximately one year ago, he was having severe urgency and frequency. He was also having nocturia 2. The symptoms completely resolve with this medication. He ran out 6 months ago because his insurance will not cover it.  He then did excellent on Vesicare 5 mg but the insurance stopped paying for it.  His symptoms are starting to come back.  Today Frequency is stable.  The patient if he drinks fluid can void every 90 minutes to 2 hours get up twice at night.  When he takes the medications above he does not get up at all and goes less frequently.   PMH: Past Medical History:  Diagnosis Date  . GERD (gastroesophageal reflux disease)   . Hyperlipemia   . Hypertension     Surgical History: Past Surgical History:  Procedure Laterality Date  . TONSILLECTOMY      Home Medications:  Allergies as of 11/26/2017   No Known Allergies     Medication List        Accurate as of 11/26/17  2:59 PM. Always use your most recent med list.          Fish Oil 1000 MG Caps Take 1 capsule by mouth.   glucosamine-chondroitin 500-400 MG tablet Take 1 tablet by mouth 3 (three) times daily.   mirabegron ER 25 MG Tb24 tablet Commonly known as:  MYRBETRIQ Take 1 tablet (25 mg total) by mouth daily.   multivitamin capsule Take 1 capsule by mouth daily.   simvastatin 20 MG tablet Commonly known as:  ZOCOR TAKE 1 TABLET (20 MG TOTAL) BY MOUTH NIGHTLY.   solifenacin 5 MG tablet Commonly known as:  VESICARE Take 1 tablet (5 mg total) by mouth daily.       Allergies: No Known Allergies  Family  History: Family History  Problem Relation Age of Onset  . Prostate cancer Neg Hx   . Kidney cancer Neg Hx   . Bladder Cancer Neg Hx     Social History:  reports that he has never smoked. He has never used smokeless tobacco. He reports that he drinks alcohol. He reports that he does not use drugs.  ROS: UROLOGY Frequent Urination?: Yes Hard to postpone urination?: No Burning/pain with urination?: No Get up at night to urinate?: Yes Leakage of urine?: No Urine stream starts and stops?: No Trouble starting stream?: No Do you have to strain to urinate?: No Blood in urine?: No Urinary tract infection?: No Sexually transmitted disease?: No Injury to kidneys or bladder?: No Painful intercourse?: No Weak stream?: No Erection problems?: No Penile pain?: No  Gastrointestinal Nausea?: No Vomiting?: No Indigestion/heartburn?: No Diarrhea?: No Constipation?: No  Constitutional Fever: No Night sweats?: No Weight loss?: No Fatigue?: No  Skin Skin rash/lesions?: No Itching?: No  Eyes Blurred vision?: No Double vision?: No  Ears/Nose/Throat Sore throat?: No Sinus problems?: No  Hematologic/Lymphatic Swollen glands?: No Easy bruising?: No  Cardiovascular Leg swelling?: No Chest pain?: No  Respiratory Cough?: No Shortness of breath?: No  Endocrine Excessive thirst?: No  Musculoskeletal Back pain?: No Joint pain?: No  Neurological Headaches?: No  Dizziness?: No  Psychologic Depression?: No Anxiety?: No  Physical Exam: BP (!) 156/79   Pulse 80   Wt 275 lb 6.4 oz (124.9 kg)   BMI 39.52 kg/m   Constitutional:  Alert and oriented, No acute distress.  Laboratory Data: Lab Results  Component Value Date   WBC 8.0 09/15/2013   HGB 14.9 09/15/2013   HCT 44.9 09/15/2013   MCV 86 09/15/2013   PLT 285 09/15/2013    Lab Results  Component Value Date   CREATININE 0.97 09/15/2013    No results found for: PSA  No results found for:  TESTOSTERONE  No results found for: HGBA1C  Urinalysis    Component Value Date/Time   COLORURINE YELLOW 09/15/2013 1330   APPEARANCEUR Clear 09/01/2015 0846   LABSPEC 1.010 09/15/2013 1330   PHURINE 6.5 09/15/2013 1330   GLUCOSEU Negative 09/01/2015 0846   GLUCOSEU NEGATIVE 09/15/2013 1330   HGBUR 1+ 09/15/2013 1330   BILIRUBINUR Negative 09/01/2015 0846   BILIRUBINUR NEGATIVE 09/15/2013 1330   KETONESUR NEGATIVE 09/15/2013 1330   PROTEINUR Negative 09/01/2015 0846   PROTEINUR NEGATIVE 09/15/2013 1330   NITRITE Negative 09/01/2015 0846   NITRITE NEGATIVE 09/15/2013 1330   LEUKOCYTESUR Negative 09/01/2015 0846   LEUKOCYTESUR NEGATIVE 09/15/2013 1330    Pertinent Imaging:   Assessment & Plan: The patient was given a prescription of Detrol LA and oxybutynin ER 4 mg and 10 mg respectively.  He will call his insurance companies to look for step coverage if they do not work.  Otherwise I see him in 1 year.  He asked me again about checking him for sexually transmitted disease but is asymptomatic.  I felt from a cost conscious perspective this was not necessary.  He did this last time as noted  There are no diagnoses linked to this encounter.  No follow-ups on file.  Martina SinnerMACDIARMID,Liller Yohn A, MD  [facility] [address], [address] [place], [place] [address] [phone]

## 2018-02-14 DIAGNOSIS — M1712 Unilateral primary osteoarthritis, left knee: Secondary | ICD-10-CM | POA: Diagnosis not present

## 2018-02-14 DIAGNOSIS — M25562 Pain in left knee: Secondary | ICD-10-CM | POA: Diagnosis not present

## 2018-03-11 DIAGNOSIS — E6609 Other obesity due to excess calories: Secondary | ICD-10-CM | POA: Diagnosis not present

## 2018-03-11 DIAGNOSIS — Z6838 Body mass index (BMI) 38.0-38.9, adult: Secondary | ICD-10-CM | POA: Diagnosis not present

## 2018-03-11 DIAGNOSIS — M1712 Unilateral primary osteoarthritis, left knee: Secondary | ICD-10-CM | POA: Diagnosis not present

## 2018-11-11 ENCOUNTER — Ambulatory Visit: Payer: 59 | Admitting: Urology

## 2019-01-20 ENCOUNTER — Ambulatory Visit: Payer: 59 | Admitting: Urology

## 2019-03-10 ENCOUNTER — Ambulatory Visit: Payer: 59 | Admitting: Urology

## 2019-03-24 ENCOUNTER — Other Ambulatory Visit: Payer: Self-pay | Admitting: Sports Medicine

## 2019-03-24 DIAGNOSIS — M25462 Effusion, left knee: Secondary | ICD-10-CM

## 2019-03-24 DIAGNOSIS — M25562 Pain in left knee: Secondary | ICD-10-CM

## 2019-03-24 DIAGNOSIS — G8929 Other chronic pain: Secondary | ICD-10-CM

## 2019-04-02 ENCOUNTER — Ambulatory Visit
Admission: RE | Admit: 2019-04-02 | Discharge: 2019-04-02 | Disposition: A | Discharge status: Home / Self Care | Payer: 59 | Source: Ambulatory Visit | Attending: Sports Medicine | Admitting: Sports Medicine

## 2019-04-02 ENCOUNTER — Other Ambulatory Visit: Payer: Self-pay

## 2019-04-02 DIAGNOSIS — M25562 Pain in left knee: Secondary | ICD-10-CM | POA: Insufficient documentation

## 2019-04-02 DIAGNOSIS — G8929 Other chronic pain: Secondary | ICD-10-CM | POA: Diagnosis present

## 2019-04-02 DIAGNOSIS — M25462 Effusion, left knee: Secondary | ICD-10-CM | POA: Insufficient documentation

## 2019-04-02 MED ORDER — GADOBUTROL 1 MMOL/ML IV SOLN
10.0000 mL | Freq: Once | INTRAVENOUS | Status: AC | PRN
  Administered 2019-04-02: 10 mL via INTRAVENOUS

## 2019-04-04 ENCOUNTER — Ambulatory Visit (INDEPENDENT_AMBULATORY_CARE_PROVIDER_SITE_OTHER): Payer: 59 | Admitting: Orthopaedic Surgery

## 2019-04-04 ENCOUNTER — Encounter: Payer: Self-pay | Admitting: Orthopaedic Surgery

## 2019-04-04 VITALS — Ht 71.0 in | Wt 270.0 lb

## 2019-04-04 DIAGNOSIS — M12261 Villonodular synovitis (pigmented), right knee: Secondary | ICD-10-CM | POA: Diagnosis not present

## 2019-04-04 NOTE — Progress Notes (Signed)
Office Visit Note   Patient: Mark Rollins           Date of Birth: [birth year]           MRN: [id number] Visit Date: 04/04/2019              Requested by: Maryland Pink, MD [address] [facility] [place],  [place] [address] PCP: Maryland Pink, MD   Assessment & Plan: Visit Diagnoses:  1. Pigmented villonodular synovitis of right knee     Plan: MRI of the left knee reviewed today shows multiple intra-articular lesions concerning for PVNS versus synovial chondromatosis.  These findings were reviewed with the patient in detail and based on our discussion I have recommended arthroscopic synovectomy and removal of the intra-articular lesions for excisional biopsy.  Patient was made aware of the risk benefits alternatives to surgery and he has elected to proceed.  Questions encouraged and answered.  He will call us when he is ready to schedule the surgery.  Follow-Up Instructions: Return if symptoms worsen or fail to improve.   Orders:  No orders of the defined types were placed in this encounter.  No orders of the defined types were placed in this encounter.     Procedures: No procedures performed   Clinical Data: No additional findings.   Subjective: Chief Complaint  Patient presents with  . Left Knee - Pain    Patient is a very pleasant 38 year old gentleman comes in for evaluation of chronic left knee pain for 10 years.  He states that this has gotten worse over the last 2 years.  Hip swells periodically and he has had it aspirated and injected with cortisone multiple times by other orthopedic surgeons in [place].  He recently had an MRI which is available for review today.  This is a somewhat of a second opinion.  He denies any constitutional symptoms.  Denies any personal history of cancer.  The knee pain is is causing him severe pain and difficulty with ADLs.    Review of Systems  Constitutional: Negative.   All other systems reviewed and are negative.     Objective: Vital Signs: Ht 5\' 11"  (1.803 m)   Wt 270 lb (122.5 kg)   BMI 37.66 kg/m   Physical Exam Vitals signs and nursing note reviewed.  Constitutional:      Appearance: He is well-developed.  HENT:     Head: Normocephalic and atraumatic.  Eyes:     Pupils: Pupils are equal, round, and reactive to light.  Neck:     Musculoskeletal: Neck supple.  Pulmonary:     Effort: Pulmonary effort is normal.  Abdominal:     Palpations: Abdomen is soft.  Musculoskeletal: Normal range of motion.  Skin:    General: Skin is warm.  Neurological:     Mental Status: He is alert and oriented to person, place, and time.  Psychiatric:        Behavior: Behavior normal.        Thought Content: Thought content normal.        Judgment: Judgment normal.     Ortho Exam Left knee exam shows a small joint effusion.  No palpable popliteal masses.  Overall well-preserved range of motion.  Collaterals and cruciates are stable.  No warmth or signs of infection. Specialty Comments:  No specialty comments available.  Imaging: No results found.   PMFS History: Patient Active Problem List   Diagnosis Date Noted  . Pigmented villonodular synovitis of  right knee 04/04/2019  . HLD (hyperlipidemia) 03/18/2015   Past Medical History:  Diagnosis Date  . GERD (gastroesophageal reflux disease)   . Hyperlipemia   . Hypertension     Family History  Problem Relation Age of Onset  . Prostate cancer Neg Hx   . Kidney cancer Neg Hx   . Bladder Cancer Neg Hx     Past Surgical History:  Procedure Laterality Date  . TONSILLECTOMY     Social History   Occupational History  . Not on file  Tobacco Use  . Smoking status: Never Smoker  . Smokeless tobacco: Never Used  Substance and Sexual Activity  . Alcohol use: Yes    Alcohol/week: 0.0 standard drinks  . Drug use: No  . Sexual activity: Not on file

## 2019-04-14 ENCOUNTER — Other Ambulatory Visit: Payer: Self-pay | Admitting: Orthopedic Surgery

## 2019-04-28 ENCOUNTER — Ambulatory Visit (INDEPENDENT_AMBULATORY_CARE_PROVIDER_SITE_OTHER): Payer: 59 | Admitting: Urology

## 2019-04-28 ENCOUNTER — Other Ambulatory Visit: Payer: Self-pay

## 2019-04-28 ENCOUNTER — Encounter: Payer: Self-pay | Admitting: Urology

## 2019-04-28 VITALS — BP 160/90 | HR 87 | Ht 71.0 in | Wt 284.0 lb

## 2019-04-28 DIAGNOSIS — R350 Frequency of micturition: Secondary | ICD-10-CM

## 2019-04-28 LAB — URINALYSIS, COMPLETE
Bilirubin, UA: NEGATIVE
Glucose, UA: NEGATIVE
Ketones, UA: NEGATIVE
Leukocytes,UA: NEGATIVE
Nitrite, UA: NEGATIVE
Protein,UA: NEGATIVE
Specific Gravity, UA: 1.025 (ref 1.005–1.030)
Urobilinogen, Ur: 0.2 mg/dL (ref 0.2–1.0)
pH, UA: 6 (ref 5.0–7.5)

## 2019-04-28 LAB — MICROSCOPIC EXAMINATION
Bacteria, UA: NONE SEEN
Epithelial Cells (non renal): NONE SEEN /hpf (ref 0–10)
WBC, UA: NONE SEEN /hpf (ref 0–5)

## 2019-04-28 MED ORDER — OXYBUTYNIN CHLORIDE ER 10 MG PO TB24: 10.0000 mg | ORAL_TABLET | Freq: Every day | ORAL | 11 refills | Status: AC

## 2019-04-28 MED ORDER — OXYBUTYNIN CHLORIDE ER 10 MG PO TB24: 10.0000 mg | ORAL_TABLET | Freq: Every day | ORAL | 11 refills | Status: DC

## 2019-04-28 NOTE — Addendum Note (Signed)
Addended by: Verlene Mayer A on: 04/28/2019 02:02 PM   Modules accepted: Orders

## 2019-04-28 NOTE — Progress Notes (Signed)
04/28/2019 1:33 PM   Mark Rollins 12-23-1980 [id number]  Referring provider: [name], MD [address] [facility] [place],  [place] [address]  Chief Complaint  Patient presents with  . Follow-up    HPI: Dr [name]:  The patient is a 38 year old gentleman with a past medical history of an overactive bladder who is onMyrbetriq25 mg daily presents today for annual follow-up. Prior to starting thistherapy approximately one year ago, he was having severe urgency and frequency. He was also having nocturia 2. The symptoms completely resolvewiththis medication. He ran out 6 months ago because his insurance will not cover it.  He then did excellent on Vesicare 5 mg but the insurance stopped paying for it.    The patient if he drinks fluid can void every 90 minutes to 2 hours get up twice at night.  When he takes the medications above he does not get up at all and goes less frequently.  The patient was given a prescription of Detrol LA and oxybutynin ER 4 mg and 10 mg respectively.  He will call his insurance companies to look for step coverage if they do not work.  Otherwise I see him in 1 year.  He asked me again about checking him for sexually transmitted disease but is asymptomatic.  I felt from a cost conscious perspective this was not necessary.  He did this last time as noted  Today Frequency is stable Oxybutynin works great but he has drooling at night.  His family doctor 5 to moderate fourth pill and it caused the drooling but also work.  Also cause a lot of constipation.  Is coming back on the oxybutynin.  He said he was step therapy the company will cover his Myrbetriq which is wonderful.  No blood no infection  30x11 sent and I will see in 1 year   PMH: Past Medical History:  Diagnosis Date  . GERD (gastroesophageal reflux disease)   . Hyperlipemia   . Hypertension     Surgical History: Past Surgical History:  Procedure Laterality Date  . TONSILLECTOMY       Home Medications:  Allergies as of 04/28/2019   No Known Allergies     Medication List       Accurate as of April 28, 2019  1:33 PM. If you have any questions, ask your nurse or doctor.        STOP taking these medications   mirabegron ER 25 MG Tb24 tablet Commonly known as: MYRBETRIQ Stopped by: Martina Sinner, MD   simvastatin 20 MG tablet Commonly known as: ZOCOR Stopped by: Martina Sinner, MD     TAKE these medications   Fish Oil 1000 MG Caps Take 1 capsule by mouth.   glucosamine-chondroitin 500-400 MG tablet Take 1 tablet by mouth 3 (three) times daily.   multivitamin capsule Take 1 capsule by mouth daily.   oxybutynin 10 MG 24 hr tablet Commonly known as: DITROPAN-XL Take 1 tablet (10 mg total) by mouth daily.   solifenacin 5 MG tablet Commonly known as: VESICARE Take 1 tablet (5 mg total) by mouth daily.   tolterodine 4 MG 24 hr capsule Commonly known as: Detrol LA Take 1 capsule (4 mg total) by mouth daily.       Allergies: No Known Allergies  Family History: Family History  Problem Relation Age of Onset  . Prostate cancer Neg Hx   . Kidney cancer Neg Hx   . Bladder Cancer Neg Hx  Social History:  reports that he has never smoked. He has never used smokeless tobacco. He reports current alcohol use. He reports that he does not use drugs.  ROS: UROLOGY Frequent Urination?: No Hard to postpone urination?: No Burning/pain with urination?: No Get up at night to urinate?: No Leakage of urine?: No Urine stream starts and stops?: No Trouble starting stream?: No Do you have to strain to urinate?: No Blood in urine?: No Urinary tract infection?: No Sexually transmitted disease?: No Injury to kidneys or bladder?: No Painful intercourse?: No Weak stream?: No Erection problems?: No Penile pain?: No  Gastrointestinal Nausea?: No Vomiting?: No Indigestion/heartburn?: No Diarrhea?: No Constipation?: No  Constitutional  Fever: No Night sweats?: No Weight loss?: No Fatigue?: No  Skin Skin rash/lesions?: No Itching?: No  Eyes Blurred vision?: No Double vision?: No  Ears/Nose/Throat Sore throat?: No Sinus problems?: No  Hematologic/Lymphatic Swollen glands?: No Easy bruising?: No  Cardiovascular Leg swelling?: No Chest pain?: No  Respiratory Cough?: No Shortness of breath?: No  Endocrine Excessive thirst?: No  Musculoskeletal Back pain?: No Joint pain?: No  Neurological Headaches?: No Dizziness?: No  Psychologic Depression?: No Anxiety?: No  Physical Exam: BP (!) 160/90   Pulse 87   Ht 5\' 11"  (1.803 m)   Wt 128.8 kg   BMI 39.61 kg/m     Laboratory Data: Lab Results  Component Value Date   WBC 8.0 09/15/2013   HGB 14.9 09/15/2013   HCT 44.9 09/15/2013   MCV 86 09/15/2013   PLT 285 09/15/2013    Lab Results  Component Value Date   CREATININE 0.97 09/15/2013    No results found for: PSA  No results found for: TESTOSTERONE  No results found for: HGBA1C  Urinalysis    Component Value Date/Time   COLORURINE YELLOW 09/15/2013 1330   APPEARANCEUR Clear 09/01/2015 0846   LABSPEC 1.010 09/15/2013 1330   PHURINE 6.5 09/15/2013 1330   GLUCOSEU Negative 09/01/2015 0846   GLUCOSEU NEGATIVE 09/15/2013 1330   HGBUR 1+ 09/15/2013 1330   BILIRUBINUR Negative 09/01/2015 0846   BILIRUBINUR NEGATIVE 09/15/2013 1330   KETONESUR NEGATIVE 09/15/2013 1330   PROTEINUR Negative 09/01/2015 0846   PROTEINUR NEGATIVE 09/15/2013 1330   NITRITE Negative 09/01/2015 0846   NITRITE NEGATIVE 09/15/2013 1330   LEUKOCYTESUR Negative 09/01/2015 0846   LEUKOCYTESUR NEGATIVE 09/15/2013 1330    Pertinent Imaging:   Assessment & Plan: Reassess in a year.  He sometimes takes the medication every 2 days.  There are no diagnoses linked to this encounter.  No follow-ups on file.  Reece Packer, MD  [facility] [address] [place]  [place], [place] [address] [phone]

## 2019-04-28 NOTE — Addendum Note (Signed)
Addended by: Verlene Mayer A on: 04/28/2019 02:37 PM   Modules accepted: Orders

## 2019-05-01 ENCOUNTER — Encounter: Payer: Self-pay | Admitting: Orthopedic Surgery

## 2019-05-06 ENCOUNTER — Other Ambulatory Visit
Admission: RE | Admit: 2019-05-06 | Discharge: 2019-05-06 | Disposition: A | Discharge status: Home / Self Care | Payer: 59 | Source: Ambulatory Visit | Attending: Orthopedic Surgery | Admitting: Orthopedic Surgery

## 2019-05-06 ENCOUNTER — Other Ambulatory Visit: Admission: RE | Admit: 2019-05-06 | Payer: 59 | Source: Ambulatory Visit

## 2019-05-06 DIAGNOSIS — Z20828 Contact with and (suspected) exposure to other viral communicable diseases: Secondary | ICD-10-CM | POA: Diagnosis not present

## 2019-05-06 DIAGNOSIS — Z01812 Encounter for preprocedural laboratory examination: Secondary | ICD-10-CM | POA: Insufficient documentation

## 2019-05-06 LAB — SARS CORONAVIRUS 2 (TAT 6-24 HRS): SARS Coronavirus 2: NEGATIVE

## 2019-05-09 ENCOUNTER — Other Ambulatory Visit: Payer: Self-pay

## 2019-05-09 ENCOUNTER — Ambulatory Visit
Admission: RE | Admit: 2019-05-09 | Discharge: 2019-05-09 | Disposition: A | Discharge status: Home / Self Care | Payer: 59 | Attending: Orthopedic Surgery | Admitting: Orthopedic Surgery

## 2019-05-09 ENCOUNTER — Encounter: Payer: Self-pay | Admitting: Orthopedic Surgery

## 2019-05-09 ENCOUNTER — Ambulatory Visit: Payer: 59 | Admitting: Anesthesiology

## 2019-05-09 ENCOUNTER — Encounter
Admission: RE | Disposition: A | Discharge status: Home / Self Care | Payer: Self-pay | Source: Home / Self Care | Attending: Orthopedic Surgery

## 2019-05-09 DIAGNOSIS — M2342 Loose body in knee, left knee: Secondary | ICD-10-CM | POA: Insufficient documentation

## 2019-05-09 DIAGNOSIS — M1712 Unilateral primary osteoarthritis, left knee: Secondary | ICD-10-CM | POA: Insufficient documentation

## 2019-05-09 DIAGNOSIS — E785 Hyperlipidemia, unspecified: Secondary | ICD-10-CM | POA: Diagnosis not present

## 2019-05-09 DIAGNOSIS — K219 Gastro-esophageal reflux disease without esophagitis: Secondary | ICD-10-CM | POA: Diagnosis not present

## 2019-05-09 DIAGNOSIS — Z79899 Other long term (current) drug therapy: Secondary | ICD-10-CM | POA: Diagnosis not present

## 2019-05-09 DIAGNOSIS — I10 Essential (primary) hypertension: Secondary | ICD-10-CM | POA: Insufficient documentation

## 2019-05-09 HISTORY — DX: Unspecified osteoarthritis, unspecified site: M19.90

## 2019-05-09 HISTORY — PX: KNEE ARTHROSCOPY: SHX127

## 2019-05-09 SURGERY — ARTHROSCOPY, KNEE
Anesthesia: General | Site: Knee | Laterality: Left

## 2019-05-09 MED ORDER — CEFAZOLIN SODIUM-DEXTROSE 2-4 GM/100ML-% IV SOLN
2.0000 g | INTRAVENOUS | Status: AC
  Administered 2019-05-09: 2 g via INTRAVENOUS

## 2019-05-09 MED ORDER — KETOROLAC TROMETHAMINE 30 MG/ML IJ SOLN
30.0000 mg | Freq: Once | INTRAMUSCULAR | Status: AC
  Administered 2019-05-09: 30 mg via INTRAVENOUS

## 2019-05-09 MED ORDER — IBUPROFEN 800 MG PO TABS: 800.0000 mg | ORAL_TABLET | Freq: Three times a day (TID) | ORAL | 1 refills | Status: AC

## 2019-05-09 MED ORDER — CHLORHEXIDINE GLUCONATE 4 % EX LIQD: 60.0000 mL | Freq: Once | CUTANEOUS | Status: DC

## 2019-05-09 MED ORDER — ONDANSETRON HCL 4 MG/2ML IJ SOLN: 4.0000 mg | Freq: Once | INTRAMUSCULAR | Status: DC | PRN

## 2019-05-09 MED ORDER — ONDANSETRON HCL 4 MG/2ML IJ SOLN
INTRAMUSCULAR | Status: DC | PRN
  Administered 2019-05-09: 4 mg via INTRAVENOUS

## 2019-05-09 MED ORDER — HYDROCODONE-ACETAMINOPHEN 5-325 MG PO TABS: 1.0000 | ORAL_TABLET | ORAL | 0 refills | Status: DC | PRN

## 2019-05-09 MED ORDER — OXYCODONE HCL 5 MG PO TABS
5.0000 mg | ORAL_TABLET | Freq: Once | ORAL | Status: AC | PRN
  Administered 2019-05-09: 5 mg via ORAL

## 2019-05-09 MED ORDER — OXYCODONE HCL 5 MG/5ML PO SOLN: 5.0000 mg | Freq: Once | ORAL | Status: AC | PRN

## 2019-05-09 MED ORDER — FENTANYL CITRATE (PF) 100 MCG/2ML IJ SOLN
INTRAMUSCULAR | Status: DC | PRN
  Administered 2019-05-09 (×3): 25 ug via INTRAVENOUS
  Administered 2019-05-09: 50 ug via INTRAVENOUS
  Administered 2019-05-09 (×7): 25 ug via INTRAVENOUS

## 2019-05-09 MED ORDER — MIDAZOLAM HCL 5 MG/5ML IJ SOLN
INTRAMUSCULAR | Status: DC | PRN
  Administered 2019-05-09: 2 mg via INTRAVENOUS

## 2019-05-09 MED ORDER — ASPIRIN EC 325 MG PO TBEC: 325.0000 mg | DELAYED_RELEASE_TABLET | Freq: Every day | ORAL | 0 refills | Status: AC

## 2019-05-09 MED ORDER — GLYCOPYRROLATE 0.2 MG/ML IJ SOLN
INTRAMUSCULAR | Status: DC | PRN
  Administered 2019-05-09: .1 mg via INTRAVENOUS

## 2019-05-09 MED ORDER — LACTATED RINGERS IV SOLN: INTRAVENOUS | Status: DC

## 2019-05-09 MED ORDER — FENTANYL CITRATE (PF) 100 MCG/2ML IJ SOLN
25.0000 ug | INTRAMUSCULAR | Status: DC | PRN
  Administered 2019-05-09 (×2): 25 ug via INTRAVENOUS
  Administered 2019-05-09: 50 ug via INTRAVENOUS

## 2019-05-09 MED ORDER — DEXAMETHASONE SODIUM PHOSPHATE 4 MG/ML IJ SOLN
INTRAMUSCULAR | Status: DC | PRN
  Administered 2019-05-09: 4 mg via INTRAVENOUS

## 2019-05-09 MED ORDER — ONDANSETRON 4 MG PO TBDP: 4.0000 mg | ORAL_TABLET | Freq: Three times a day (TID) | ORAL | 0 refills | Status: DC | PRN

## 2019-05-09 MED ORDER — ACETAMINOPHEN 500 MG PO TABS: 1000.0000 mg | ORAL_TABLET | Freq: Three times a day (TID) | ORAL | 2 refills | Status: AC

## 2019-05-09 MED ORDER — PROPOFOL 10 MG/ML IV BOLUS
INTRAVENOUS | Status: DC | PRN
  Administered 2019-05-09: 200 mg via INTRAVENOUS

## 2019-05-09 MED ORDER — LIDOCAINE-EPINEPHRINE 1 %-1:100000 IJ SOLN
INTRAMUSCULAR | Status: DC | PRN
  Administered 2019-05-09 (×2): 10 mL via INTRAMUSCULAR

## 2019-05-09 MED ORDER — LIDOCAINE HCL (CARDIAC) PF 100 MG/5ML IV SOSY
PREFILLED_SYRINGE | INTRAVENOUS | Status: DC | PRN
  Administered 2019-05-09: 40 mg via INTRATRACHEAL

## 2019-05-09 SURGICAL SUPPLY — 45 items
ADAPTER IRRIG TUBE 2 SPIKE SOL (ADAPTER) ×4 IMPLANT
BLADE SHAVER 4.5 DBL SERAT CV (CUTTER) ×1 IMPLANT
BLADE SHAVER 4.5X7 STR FR (MISCELLANEOUS) ×1 IMPLANT
BLADE SURG 15 STRL LF DISP TIS (BLADE) IMPLANT
BLADE SURG 15 STRL SS (BLADE) ×1
BLADE SURG SZ11 CARB STEEL (BLADE) ×2 IMPLANT
BNDG COHESIVE 4X5 TAN STRL (GAUZE/BANDAGES/DRESSINGS) ×2 IMPLANT
BNDG ESMARK 6X12 TAN STRL LF (GAUZE/BANDAGES/DRESSINGS) ×1 IMPLANT
BUR RADIUS 3.5 (BURR) ×2 IMPLANT
CHLORAPREP W/TINT 26 (MISCELLANEOUS) ×1 IMPLANT
COOLER POLAR GLACIER W/PUMP (MISCELLANEOUS) ×2 IMPLANT
COVER LIGHT HANDLE UNIVERSAL (MISCELLANEOUS) ×4 IMPLANT
COVER WAND RF STERILE (DRAPES) ×1 IMPLANT
CUFF TOURN SGL QUICK 30 (TOURNIQUET CUFF) ×1
CUFF TRNQT CYL 30X4X21-28X (TOURNIQUET CUFF) IMPLANT
DECANTER SPIKE VIAL GLASS SM (MISCELLANEOUS) ×3 IMPLANT
DRAPE IMP U-DRAPE 54X76 (DRAPES) ×2 IMPLANT
DRAPE U-SHAPE 48X52 POLY STRL (PACKS) ×2 IMPLANT
GAUZE SPONGE 4X4 12PLY STRL (GAUZE/BANDAGES/DRESSINGS) ×2 IMPLANT
GLOVE BIO SURGEON STRL SZ7.5 (GLOVE) ×3 IMPLANT
GLOVE BIOGEL PI IND STRL 8 (GLOVE) ×1 IMPLANT
GLOVE BIOGEL PI INDICATOR 8 (GLOVE) ×2
GOWN STRL REIN 2XL XLG LVL4 (GOWN DISPOSABLE) ×3 IMPLANT
IV LACTATED RINGER IRRG 3000ML (IV SOLUTION) ×20
IV LR IRRIG 3000ML ARTHROMATIC (IV SOLUTION) ×4 IMPLANT
KIT TURNOVER KIT A (KITS) ×2 IMPLANT
MANIFOLD NEPTUNE II (INSTRUMENTS) ×1 IMPLANT
MAT ABSORB  FLUID 56X50 GRAY (MISCELLANEOUS) ×1
MAT ABSORB FLUID 56X50 GRAY (MISCELLANEOUS) ×1 IMPLANT
NDL HYPO 21X1.5 SAFETY (NEEDLE) ×1 IMPLANT
NEEDLE HYPO 21X1.5 SAFETY (NEEDLE) ×2 IMPLANT
PACK ARTHROSCOPY KNEE (MISCELLANEOUS) ×2 IMPLANT
PAD ABD DERMACEA PRESS 5X9 (GAUZE/BANDAGES/DRESSINGS) ×3 IMPLANT
PAD WRAPON POLAR KNEE (MISCELLANEOUS) ×1 IMPLANT
SET TUBE SUCT SHAVER OUTFL 24K (TUBING) ×2 IMPLANT
SET TUBE TIP INTRA-ARTICULAR (MISCELLANEOUS) ×2 IMPLANT
SOL ANTI-FOG 6CC FOG-OUT (MISCELLANEOUS) IMPLANT
SOL FOG-OUT ANTI-FOG 6CC (MISCELLANEOUS) ×1
SUT ETHILON 3-0 FS-10 30 BLK (SUTURE) ×2
SUT VIC AB 2-0 CT2 27 (SUTURE) ×1 IMPLANT
SUTURE EHLN 3-0 FS-10 30 BLK (SUTURE) ×1 IMPLANT
TOWEL OR 17X26 4PK STRL BLUE (TOWEL DISPOSABLE) ×5 IMPLANT
TUBING ARTHRO INFLOW-ONLY STRL (TUBING) ×2 IMPLANT
WAND WEREWOLF FLOW 90D (MISCELLANEOUS) ×2 IMPLANT
WRAPON POLAR PAD KNEE (MISCELLANEOUS) ×2

## 2019-05-09 NOTE — Op Note (Addendum)
Operative Note   SURGERY DATE: 05/09/2019  PRE-OP DIAGNOSIS:  1. Left knee loose bodies 2. Left knee pigmented villonodular synovitis 3.  Left knee lateral compartment degenerative changes  POST-OP DIAGNOSIS: 1. Left knee loose bodies 2. Left knee pigmented villonodular synovitis (pending biopsy results) 3. Left knee lateral compartment degenerative changes  PROCEDURES:  1. Left knee arthroscopic loose body removal 2. Left knee extensive synovectomy involving the anterior, medial, lateral, and posterior synovium for presumed pigmented villonodular synovitis 3. Left knee chondroplasty  SURGEON: Cato Mulligan, MD  ANESTHESIA: Gen  SPECIMEN:  1.  Synovial tissue from left knee 2.  Multiple loose bodies from left knee  ESTIMATED BLOOD LOSS:minimal  TOTAL IV FLUIDS: per anesthesia  INDICATION(S):  Mark Rollins is a 38 y.o. male with chronic history of left knee pain and effusion.  He had failed nonoperative management in the form of multiple medications including rheumatologic, activity modifications, and physical therapy.  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 After discussion of risks, benefits, and alternatives to surgery, the patient elected to proceed.    OPERATIVE FINDINGS:   Examination under anesthesia: A careful examination under anesthesia was performed.  Passive range of motion was: Hyperextension: 1.  Extension: 0.  Flexion: 130.  Lachman: normal. Pivot Shift: normal.  Posterior drawer: normal.  Varus stability in full extension: normal.  Varus stability in 30 degrees of flexion: normal.  Valgus stability in full extension: normal.  Valgus stability in 30 degrees of flexion: normal.   Intra-operative findings:A thorough arthroscopic examination of the knee was performed. The findings are: 1. Suprapatellar pouch: Diffuse synovitis with reddish-yellow fronds diffusely 2. Undersurface of median  ridge: Grade 1 softening 3. Medial patellar facet: Grade 1 softening 4. Lateral patellar facet: Grade 1 softening 5. Trochlea: Normal 6. Lateral gutter/popliteus tendon: Multiple loose bodies in the lateral gutter, diffuse synovitis with reddish-yellow fronds 7. Hoffa's fat pad: Inflamed 8. Medial gutter/plica: diffuse synovitis with reddish-yellow fronds 9. ACL: ACL was intact.  Large calcific loose body measuring 22 x 12 mm immediately anterior to the ACL 10. PCL: PCL was intact.  There was a large calcific loose body measuring 16 x 77mm posterior to the PCL with adjacent cartilaginous loose body measuring approximately 10 x 8 mm 11. Medial meniscus: normal 12. Medial compartment cartilage: Areas of grade 1-2 degenerative changes focally 13. Lateral meniscus: Normal 14. Lateral compartment cartilage: Grade 2-3 degenerative changes of the lateral tibial plateau  OPERATIVE REPORT:   I identified Mark Rollins the pre-operative holding area. I marked theoperativeknee with my initials. I reviewed the risks and benefits of the proposed surgical intervention and the patient (and/or patient's guardian) wished to proceed. The patient was transferred to the operative suite and placed in the supine position with all bony prominences padded. Anesthesia was administered.  Appropriate IV antibiotics were given within 30 minutes of incision.  The extremity was then prepped and draped in standard fashion. A time out was performed confirming the correct extremity, correct patient, and correct procedure.  Arthroscopy portals were marked. Local anesthetic was injected to the planned portal sites. The anterolateral portal was established with an 11 blade.      The arthroscope was placed in the anterolateral portal and then into the suprapatellar pouch. Next the medial portal was established under needle localization.  The large anterior loose body anterior to the ACL was removed with a grasper.   A  diagnostic arthroscopy  was performed with results as indicated above.  A chondroplasty was performed of the medial compartment and lateral compartment such that there were stable cartilage edges without any loose fragments of cartilage.   Multiple samples of the reddish-yellow frond-like tissue from the synovium was sent for pathology.  This was obtained by using a grasper.    Next, an extensive synovectomy of the suprapatellar pouch was performed using combination of an oscillating shaver (curved and straight) and ArthroCare wand.  An accessory superolateral portal was made to assist with this.  Next, multiple loose bodies were identified in the lateral gutter and these were removed with an arthroscopic grasper.  An oscillating shaver and ArthroCare wand were used to perform lateral synovectomy.  A medial synovectomy was performed in a similar fashion.   Then, I placed the arthroscope into the posterolateral compartment using a Gillquist maneuver.  A posterior lateral portal was made under needle localization.  A synovectomy of the posterior lateral compartment was performed using an ArthroCare wand and oscillating shaver.  Next, a change viewing portals such that as viewing from the posterior lateral portal the oscillating shaver and ArthroCare wand were both brought through the anteromedial portal into the posterior lateral compartment and any synovitic tissue about the ACL and PCL was debrided.  Appropriate posterior lateral synovectomy was confirmed.  Next, using a Gillquist maneuver, I placed the arthroscope into the posteromedial compartment.  A posterior medial portal was made under needle localization.  A synovectomy of the posterior medial compartment was performed using an ArthroCare wand and oscillating shavers.  Next, I placed the arthroscope into the posterior lateral portal and identified the loose bodies posterior to the PCL.  Arthroscope was switched back to the anterolateral portal and placed  into the posterior medial compartment.    Accessory posterior medial portals x2 were made.  Arthroscope was then placed into one of these accessory portals. An oscillating shaver was used to take down the septum. A large 90 degrees clamp was used to remove the loose bodies from the posterior aspect of the knee using one of the accessory posterior medial portals.  Hemostasis was achieved with an ArthroCare wand.  Arthroscope was switched back to the suprapatellar pouch to ensure there is no further loose bodies.  Fluid was evacuated from the knee.  Previously inflated tourniquet was released.  Total tourniquet time was 135 minutes.  The portals were closed with 3-0 Nylon suture. Sterile dressings included Xeroform, 4x4s, Sof-Rol, and Bias wrap. A Polarcare was placed.  The patient was then awakened and taken to the PACU hemodynamically stable without complication.  Additionally, this case had increased complexity compared to standard synovectomy and loose body removal given the multiple large loose bodies and pathology in the posterior aspect of the knee.  Multiple accessory portals including superolateral, posterior medial x3, and posterior lateral portals were made which normally do not occur.  Due to this, the surgery took approximately 90 minutes longer than standard complete synovectomy and loose body removal.     POSTOPERATIVE PLAN: The patient will be weightbearing as tolerated on his operative extremity.  He will attend physical therapy on postoperative day #3-4.  ASA 325 mg daily for DVT prophylaxis.  Follow-up with me in approximately 2 weeks.

## 2019-05-09 NOTE — Discharge Instructions (Signed)
Arthroscopic Knee Surgery °  °Post-Op Instructions °  °1. Bracing or crutches: Crutches will be provided at the time of discharge from the surgery center if you do not already have them. °  °2. Ice: You may be provided with a device (Polar Care) that allows you to ice the affected area effectively. Otherwise you can ice manually.  °  °3. Driving:  Plan on not driving for at least one week. Please note that you are advised NOT to drive while taking narcotic pain medications as you may be impaired and unsafe to drive. °  °4. Activity: Ankle pumps several times an hour while awake to prevent blood clots. Weight bearing: as tolerated. Use crutches for as needed (usually ~1 week or less) until pain allows you to ambulate without a limp. Bending and straightening the knee is unlimited. Elevate knee above heart level as much as possible for one week. Avoid standing more than 5 minutes (consecutively) for the first week.  Avoid long distance travel for 2 weeks. °  °5. Medications:  °- You have been provided a prescription for narcotic pain medicine. After surgery, take 1-2 narcotic tablets every 4 hours if needed for severe pain.  °- You may take up to 3000mg/day of tylenol (acetaminophen). You can take 1000mg 3x/day. Please check your narcotic. If you have acetaminophen in your narcotic (each tablet will be 325mg), be careful not to exceed a total of 3000mg/day of acetaminophen.  °- A prescription for anti-nausea medication will be provided in case the narcotic medicine causes nausea - take 1 tablet every 6 hours only if nauseated.  °- Take ibuprofen 800 mg every 8 hours WITH food to reduce post-operative knee swelling. DO NOT STOP IBUPROFEN POST-OP UNTIL INSTRUCTED TO DO SO at first post-op office visit (10-14 days after surgery). However, please discontinue if you have any abdominal discomfort after taking this.  °- Take enteric coated aspirin 325 mg once daily for 2 weeks to prevent blood clots.  ° °6. Bandages: The  physical therapist should change the bandages at the first post-op appointment. If needed, the dressing supplies have been provided to you. °  °7. Physical Therapy: 1-2 times per week for 6 weeks. Therapy typically starts on post operative Day 3 or 4. You have been provided an order for physical therapy. The therapist will provide home exercises. °  °8. Work: May return to full work usually around 2 weeks after 1st post-operative visit. May do light duty/desk job in approximately 1-2 weeks when off of narcotics, pain is well-controlled, and swelling has decreased. Labor intensive jobs may require 4-6 weeks to return.  °  °  °9. Post-Op Appointments: °Your first post-op appointment will be with Dr. Parker Wherley in approximately 2 weeks time.  °  °If you find that they have not been scheduled please call the [facility] front desk at [phone]. ° ° ° °General Anesthesia, Adult, Care After °This sheet gives you information about how to care for yourself after your procedure. Your health care provider may also give you more specific instructions. If you have problems or questions, contact your health care provider. °What can I expect after the procedure? °After the procedure, the following side effects are common: °· Pain or discomfort at the IV site. °· Nausea. °· Vomiting. °· Sore throat. °· Trouble concentrating. °· Feeling cold or chills. °· Weak or tired. °· Sleepiness and fatigue. °· Soreness and body aches. These side effects can affect parts of the body that were not   involved in surgery. °Follow these instructions at home: ° °For at least 24 hours after the procedure: °· Have a responsible adult stay with you. It is important to have someone help care for you until you are awake and alert. °· Rest as needed. °· Do not: °? Participate in activities in which you could fall or become injured. °? Drive. °? Use heavy machinery. °? Drink alcohol. °? Take sleeping pills or medicines that cause  drowsiness. °? Make important decisions or sign legal documents. °? Take care of children on your own. °Eating and drinking °· Follow any instructions from your health care provider about eating or drinking restrictions. °· When you feel hungry, start by eating small amounts of foods that are soft and easy to digest (bland), such as toast. Gradually return to your regular diet. °· Drink enough fluid to keep your urine pale yellow. °· If you vomit, rehydrate by drinking water, juice, or clear broth. °General instructions °· If you have sleep apnea, surgery and certain medicines can increase your risk for breathing problems. Follow instructions from your health care provider about wearing your sleep device: °? Anytime you are sleeping, including during daytime naps. °? While taking prescription pain medicines, sleeping medicines, or medicines that make you drowsy. °· Return to your normal activities as told by your health care provider. Ask your health care provider what activities are safe for you. °· Take over-the-counter and prescription medicines only as told by your health care provider. °· If you smoke, do not smoke without supervision. °· Keep all follow-up visits as told by your health care provider. This is important. °Contact a health care provider if: °· You have nausea or vomiting that does not get better with medicine. °· You cannot eat or drink without vomiting. °· You have pain that does not get better with medicine. °· You are unable to pass urine. °· You develop a skin rash. °· You have a fever. °· You have redness around your IV site that gets worse. °Get help right away if: °· You have difficulty breathing. °· You have chest pain. °· You have blood in your urine or stool, or you vomit blood. °Summary °· After the procedure, it is common to have a sore throat or nausea. It is also common to feel tired. °· Have a responsible adult stay with you for the first 24 hours after general anesthesia. It is  important to have someone help care for you until you are awake and alert. °· When you feel hungry, start by eating small amounts of foods that are soft and easy to digest (bland), such as toast. Gradually return to your regular diet. °· Drink enough fluid to keep your urine pale yellow. °· Return to your normal activities as told by your health care provider. Ask your health care provider what activities are safe for you. °This information is not intended to replace advice given to you by your health care provider. Make sure you discuss any questions you have with your health care provider. °Document Released: 08/14/2000 Document Revised: 05/11/2017 Document Reviewed: 12/22/2016 °Elsevier Patient Education © 2020 [employer] ° °

## 2019-05-09 NOTE — Anesthesia Procedure Notes (Signed)
Procedure Name: LMA Insertion Date/Time: 05/09/2019 1:38 PM Performed by: Cameron Ali, CRNA Pre-anesthesia Checklist: Patient identified, Emergency Drugs available, Suction available, Timeout performed and Patient being monitored Patient Re-evaluated:Patient Re-evaluated prior to induction Oxygen Delivery Method: Circle system utilized Preoxygenation: Pre-oxygenation with 100% oxygen Induction Type: IV induction LMA: LMA inserted LMA Size: 5.0 Number of attempts: 1 Placement Confirmation: positive ETCO2 and breath sounds checked- equal and bilateral Tube secured with: Tape Dental Injury: Teeth and Oropharynx as per pre-operative assessment  Comments: Easy mask 1 provider.

## 2019-05-09 NOTE — Transfer of Care (Signed)
Immediate Anesthesia Transfer of Care Note  Patient: Mark Rollins  Procedure(s) Performed: ARTHROSCOPY KNEE, COMPLETE SYNOVECTOMY, LOOSE BODY REMOVAL, CHONDROPLASTY (Left Knee)  Patient Location: PACU  Anesthesia Type: General  Level of Consciousness: awake, alert  and patient cooperative  Airway and Oxygen Therapy: Patient Spontanous Breathing and Patient connected to supplemental oxygen  Post-op Assessment: Post-op Vital signs reviewed, Patient's Cardiovascular Status Stable, Respiratory Function Stable, Patent Airway and No signs of Nausea or vomiting  Post-op Vital Signs: Reviewed and stable  Complications: No apparent anesthesia complications

## 2019-05-09 NOTE — Anesthesia Preprocedure Evaluation (Signed)
Anesthesia Evaluation  Patient identified by MRN, date of birth, ID band Patient awake    Reviewed: Allergy & Precautions, H&P , NPO status , Patient's Chart, lab work & pertinent test results, reviewed documented beta blocker date and time   Airway Mallampati: II  TM Distance: >3 FB Neck ROM: full    Dental no notable dental hx.    Pulmonary Current Smoker and Patient abstained from smoking.,    Pulmonary exam normal breath sounds clear to auscultation       Cardiovascular Exercise Tolerance: Good hypertension,  Rhythm:regular Rate:Normal     Neuro/Psych negative neurological ROS  negative psych ROS   GI/Hepatic Neg liver ROS, GERD  ,  Endo/Other  negative endocrine ROS  Renal/GU negative Renal ROS  negative genitourinary   Musculoskeletal   Abdominal   Peds  Hematology negative hematology ROS (+)   Anesthesia Other Findings   Reproductive/Obstetrics negative OB ROS                             Anesthesia Physical Anesthesia Plan  ASA: II  Anesthesia Plan: General   Post-op Pain Management:    Induction:   PONV Risk Score and Plan: 1 and Ondansetron and Dexamethasone  Airway Management Planned:   Additional Equipment:   Intra-op Plan:   Post-operative Plan:   Informed Consent: I have reviewed the patients History and Physical, chart, labs and discussed the procedure including the risks, benefits and alternatives for the proposed anesthesia with the patient or authorized representative who has indicated his/her understanding and acceptance.     Dental Advisory Given  Plan Discussed with: CRNA  Anesthesia Plan Comments:         Anesthesia Quick Evaluation

## 2019-05-09 NOTE — H&P (Signed)
Paper H&P to be scanned into permanent record. H&P reviewed. No significant changes noted.  

## 2019-05-12 ENCOUNTER — Encounter: Payer: Self-pay | Admitting: *Deleted

## 2019-05-14 LAB — SURGICAL PATHOLOGY

## 2019-05-29 NOTE — Anesthesia Postprocedure Evaluation (Signed)
Anesthesia Post Note  Patient: Mark Rollins  Procedure(s) Performed: ARTHROSCOPY KNEE, COMPLETE SYNOVECTOMY, LOOSE BODY REMOVAL, CHONDROPLASTY (Left Knee)     Patient location during evaluation: PACU Anesthesia Type: General Level of consciousness: awake and alert Pain management: pain level controlled Vital Signs Assessment: post-procedure vital signs reviewed and stable Respiratory status: spontaneous breathing, nonlabored ventilation, respiratory function stable and patient connected to nasal cannula oxygen Cardiovascular status: blood pressure returned to baseline and stable Postop Assessment: no apparent nausea or vomiting Anesthetic complications: no    Scarlette Slice

## 2019-08-17 ENCOUNTER — Ambulatory Visit: Payer: Self-pay | Attending: Internal Medicine

## 2019-08-17 DIAGNOSIS — Z23 Encounter for immunization: Secondary | ICD-10-CM

## 2019-08-17 NOTE — Progress Notes (Signed)
   Covid-19 Vaccination Clinic  Name:  PIOTR CHRISTOPHER    MRN: [id number] DOB: 1981-04-17  08/17/2019  Mr. Oppedisano was observed post Covid-19 immunization for 15 minutes without incident. He was provided with Vaccine Information Sheet and instruction to access the V-Safe system.   Mr. Lenzen was instructed to call 911 with any severe reactions post vaccine: Marland Kitchen Difficulty breathing  . Swelling of face and throat  . A fast heartbeat  . A bad rash all over body  . Dizziness and weakness   Immunizations Administered    Name Date Dose VIS Date Route   Pfizer COVID-19 Vaccine 08/17/2019 12:52 PM 0.3 mL 05/02/2019 Intramuscular   Manufacturer: [employer], [employer]   Lot: [id number]   NDC: [address]-2

## 2019-09-09 ENCOUNTER — Ambulatory Visit: Payer: Self-pay | Attending: Internal Medicine

## 2019-09-09 DIAGNOSIS — Z23 Encounter for immunization: Secondary | ICD-10-CM

## 2019-09-09 NOTE — Progress Notes (Signed)
   Covid-19 Vaccination Clinic  Name:  Mark Rollins    MRN: [id number] DOB: Jun 26, 1980  09/09/2019  [name] was observed post Covid-19 immunization for 15 minutes without incident. He was provided with Vaccine Information Sheet and instruction to access the V-Safe system.   [name] was instructed to call 911 with any severe reactions post vaccine: Marland Kitchen Difficulty breathing  . Swelling of face and throat  . A fast heartbeat  . A bad rash all over body  . Dizziness and weakness   Immunizations Administered    Name Date Dose VIS Date Route   Pfizer COVID-19 Vaccine 09/09/2019  2:04 PM 0.3 mL 07/16/2018 Intramuscular   Manufacturer: [employer], [employer]   Lot: [id number]   NDC: [address]-8

## 2020-03-29 ENCOUNTER — Ambulatory Visit: Payer: 59 | Admitting: Urology

## 2020-04-05 ENCOUNTER — Ambulatory Visit (INDEPENDENT_AMBULATORY_CARE_PROVIDER_SITE_OTHER): Payer: BC Managed Care – PPO | Admitting: Urology

## 2020-04-05 ENCOUNTER — Encounter: Payer: Self-pay | Admitting: Urology

## 2020-04-05 ENCOUNTER — Other Ambulatory Visit: Payer: Self-pay

## 2020-04-05 VITALS — BP 156/102 | HR 80

## 2020-04-05 DIAGNOSIS — R300 Dysuria: Secondary | ICD-10-CM | POA: Diagnosis not present

## 2020-04-05 DIAGNOSIS — R350 Frequency of micturition: Secondary | ICD-10-CM | POA: Diagnosis not present

## 2020-04-05 MED ORDER — MELOXICAM 7.5 MG PO TABS: 7.5000 mg | ORAL_TABLET | Freq: Every day | ORAL | 0 refills | Status: DC

## 2020-04-05 MED ORDER — CIPROFLOXACIN HCL 500 MG PO TABS: 500.0000 mg | ORAL_TABLET | Freq: Two times a day (BID) | ORAL | 0 refills | Status: DC

## 2020-04-05 NOTE — Progress Notes (Signed)
04/05/2020 9:31 AM   Mark Rollins 06/23/1980 [id number]  Referring provider: [name], MD [address] [place],  [place] [address]  Chief Complaint  Patient presents with  . Follow-up    HPI: Dr Bud:The patient is a 39 year old gentleman with a past medical history of an overactive bladder who is onMyrbetriq25 mg daily presents today for annual follow-up. Prior to starting thistherapy approximately one year ago, he was having severe urgency and frequency. He was also having nocturia 2. The symptoms completely resolvewiththis medication. He ran out 6 months ago because his insurance will not cover it.He then did excellent on Vesicare 5 mg but the insurance stopped paying for it.   The patient if he drinks fluid can void every 90 minutes to 2 hours get up twice at night. When he takes the medications above he does not get up at all and goes less frequently.  The patient was given a prescription of Detrol LA and oxybutynin ER 4 mg and 10 mg respectively. He will call his insurance companies to look for step coverage if they do not work. Otherwise I see him in 1 year. He asked me again about checking him for sexually transmitted disease but is asymptomatic. I felt from a cost conscious perspective this was not necessary. He did this last time as noted  Oxybutynin works great but he has drooling at night.  His family doctor 5 to moderate fourth pill and it caused the drooling but also work.  Also cause a lot of constipation.  Is coming back on the oxybutynin.  He said he was step therapy the company will cover his Myrbetriq which is wonderful.   30x11 sent and I will see in 1 year He sometimes takes the medication every 2 days.  Today The patient is back on oxybutynin.  It helps.  He still gets some drooling He was told on 2 or 3 urinalysis had a trace of blood in the urine.  He smokes 1 cigar a week.  No history of kidney stones or bladder  surgery.  Each day he has mild burning at the beginning of urination.  Voids every 2-3 hours gets up once at night  He said all 3 times it was definitely a trace.  He says the burning is intermittent for about the last 5 months and wears protection for intercourse  I reviewed the medical record and could not find any evidence of trace of blood or microscopic hematuria.      PMH: Past Medical History:  Diagnosis Date  . Arthritis    knee  . GERD (gastroesophageal reflux disease)   . Hyperlipemia   . Hypertension     Surgical History: Past Surgical History:  Procedure Laterality Date  . KNEE ARTHROSCOPY Left 05/09/2019   Procedure: ARTHROSCOPY KNEE, COMPLETE SYNOVECTOMY, LOOSE BODY REMOVAL, CHONDROPLASTY;  Surgeon: Signa Kell, MD;  Location: [facility];  Service: Orthopedics;  Laterality: Left;  . TONSILLECTOMY      Home Medications:  Allergies as of 04/05/2020   No Known Allergies     Medication List       Accurate as of April 05, 2020  9:31 AM. If you have any questions, ask your nurse or doctor.        STOP taking these medications   HYDROcodone-acetaminophen 5-325 MG tablet Commonly known as: Norco Stopped by: Martina Sinner, MD   ondansetron 4 MG disintegrating tablet Commonly known as: Zofran ODT Stopped by:  Martina Sinner, MD     TAKE these medications   acetaminophen 500 MG tablet Commonly known as: TYLENOL Take 2 tablets (1,000 mg total) by mouth every 8 (eight) hours.   Fish Oil 1000 MG Caps Take 1 capsule by mouth.   glucosamine-chondroitin 500-400 MG tablet Take 1 tablet by mouth 3 (three) times daily.   multivitamin capsule Take 1 capsule by mouth daily.   oxybutynin 10 MG 24 hr tablet Commonly known as: DITROPAN-XL Take 1 tablet (10 mg total) by mouth daily.   rosuvastatin 10 MG tablet Commonly known as: CRESTOR Take 10 mg by mouth daily.   solifenacin 10 MG tablet Commonly known as: VESICARE   venlafaxine XR  75 MG 24 hr capsule Commonly known as: EFFEXOR-XR Take by mouth.       Allergies: No Known Allergies  Family History: Family History  Problem Relation Age of Onset  . Prostate cancer Neg Hx   . Kidney cancer Neg Hx   . Bladder Cancer Neg Hx     Social History:  reports that he has been smoking cigars. He has never used smokeless tobacco. He reports current alcohol use of about 3.0 standard drinks of alcohol per week. He reports that he does not use drugs.  ROS:                                        Physical Exam: There were no vitals taken for this visit.  Constitutional:  Alert and oriented, No acute distress.   Laboratory Data: Lab Results  Component Value Date   WBC 8.0 09/15/2013   HGB 14.9 09/15/2013   HCT 44.9 09/15/2013   MCV 86 09/15/2013   PLT 285 09/15/2013    Lab Results  Component Value Date   CREATININE 0.97 09/15/2013    No results found for: PSA  No results found for: TESTOSTERONE  No results found for: HGBA1C  Urinalysis    Component Value Date/Time   COLORURINE YELLOW 09/15/2013 1330   APPEARANCEUR Clear 04/28/2019 1445   LABSPEC 1.010 09/15/2013 1330   PHURINE 6.5 09/15/2013 1330   GLUCOSEU Negative 04/28/2019 1445   GLUCOSEU NEGATIVE 09/15/2013 1330   HGBUR 1+ 09/15/2013 1330   BILIRUBINUR Negative 04/28/2019 1445   BILIRUBINUR NEGATIVE 09/15/2013 1330   KETONESUR NEGATIVE 09/15/2013 1330   PROTEINUR Negative 04/28/2019 1445   PROTEINUR NEGATIVE 09/15/2013 1330   NITRITE Negative 04/28/2019 1445   NITRITE NEGATIVE 09/15/2013 1330   LEUKOCYTESUR Negative 04/28/2019 1445   LEUKOCYTESUR NEGATIVE 09/15/2013 1330    Pertinent Imaging: Part reviewed.  Urine reviewed.  Urine sent for culture bacteria Myco plasma gonorrhea and chlamydia  Assessment & Plan: We talked about prostatitis.  I will see him back in 6 weeks to recheck her urine for microscopic hematuria.  He understands he does not meet the definition  and were not going to go through the work-up or cystoscopy as noted today and explained.  I called in Mobic 15 mg a day 30 tablets and 1 refill.  I called in ciprofloxacin 500 mg twice a day for 30 days.  He understands he could have low-grade prostatitis and/or inflammation of bulbar urethra.  Call with culture results.  He did note that ejaculation is not painful but just feels different  There are no diagnoses linked to this encounter.  No follow-ups on file.  Martina Sinner, MD  [facility] [address] [place], [place] [address] [phone]

## 2020-04-06 LAB — URINALYSIS, COMPLETE
Bilirubin, UA: NEGATIVE
Glucose, UA: NEGATIVE
Ketones, UA: NEGATIVE
Leukocytes,UA: NEGATIVE
Nitrite, UA: NEGATIVE
Protein,UA: NEGATIVE
Specific Gravity, UA: 1.02 (ref 1.005–1.030)
Urobilinogen, Ur: 1 mg/dL (ref 0.2–1.0)
pH, UA: 7 (ref 5.0–7.5)

## 2020-04-06 LAB — MICROSCOPIC EXAMINATION
Bacteria, UA: NONE SEEN
Epithelial Cells (non renal): NONE SEEN /hpf (ref 0–10)

## 2020-04-07 ENCOUNTER — Telehealth: Payer: Self-pay

## 2020-04-07 LAB — GC/CHLAMYDIA PROBE AMP
Chlamydia trachomatis, NAA: NEGATIVE
Neisseria Gonorrhoeae by PCR: NEGATIVE

## 2020-04-07 LAB — CULTURE, URINE COMPREHENSIVE

## 2020-04-07 NOTE — Telephone Encounter (Signed)
Patient called stating cipro is on back order, are we able to send in something else

## 2020-04-07 NOTE — Telephone Encounter (Signed)
-----   Message from Alfredo Martinez, MD sent at 04/07/2020 12:45 PM EST ----- Please let patient know that all cultures are negative   ----- Message ----- From: Lizbeth Bark, CMA Sent: 04/07/2020   7:55 AM EST To: Alfredo Martinez, MD   ----- Message ----- From: Interface, [facility] Lab Results In Sent: 04/06/2020  11:37 AM EST To: [facility]

## 2020-04-07 NOTE — Telephone Encounter (Signed)
Pt aware of results 

## 2020-04-09 MED ORDER — SULFAMETHOXAZOLE-TRIMETHOPRIM 800-160 MG PO TABS: 1.0000 | ORAL_TABLET | Freq: Two times a day (BID) | ORAL | 0 refills | Status: AC

## 2020-04-09 NOTE — Telephone Encounter (Signed)
Septra DS 1 tablet twice a day for 3 days

## 2020-04-09 NOTE — Addendum Note (Signed)
Addended by: Veneta Penton on: 04/09/2020 12:11 PM   Modules accepted: Orders

## 2020-04-09 NOTE — Telephone Encounter (Signed)
Sent to pharmacy, left message

## 2020-04-12 LAB — MYCOPLASMA / UREAPLASMA CULTURE
Mycoplasma hominis Culture: NEGATIVE
Ureaplasma urealyticum: NEGATIVE

## 2020-05-04 IMAGING — MR MR KNEE*L* WO/W CM
10 series · 40 of 40 positions shown · IV contrast (gadavist)
Comparison: None.

CLINICAL DATA: Chronic, intermittent left knee pain. No known
injury.

EXAM:
MRI OF THE LEFT KNEE WITHOUT AND WITH CONTRAST
TECHNIQUE: Multiplanar, multisequence MR imaging of the knee was performed
before and after the administration of intravenous contrast.
CONTRAST:  10 mL GADAVIST IV

[Series 8: T2 fat-sat · axial · left · 4.0mm · 0.50mm/px · z∈[-59,+66]mm · 3 of 26 slices shown (1 of 3)]
[im 1/26]
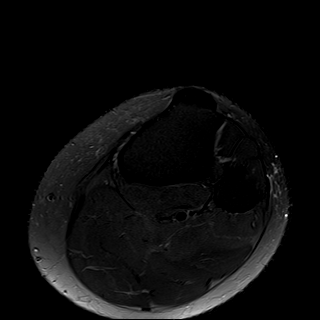
[im 13/26]
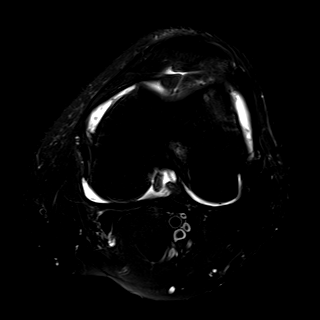
[im 26/26]
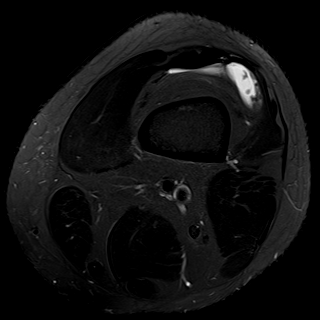

[Series 9: T2 fat-sat · coronal · left · 4.0mm · 0.59mm/px · 4 of 28 slices shown (2 of 3)]
[im 1/28]
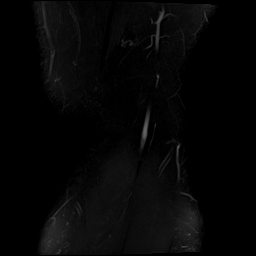
[im 10/28]
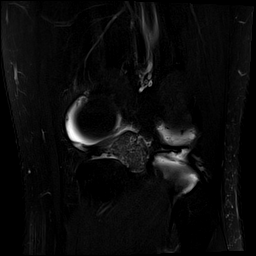
[im 19/28]
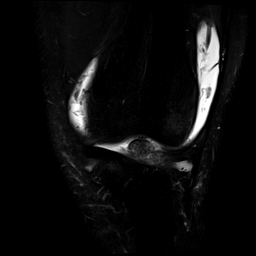
[im 28/28]
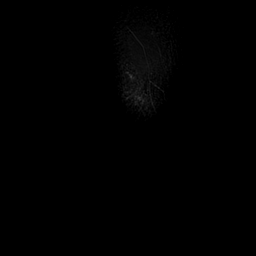

[Series 10: T1 · coronal · left · 4.0mm · 0.59mm/px · 4 of 28 slices shown]
[im 1/28]
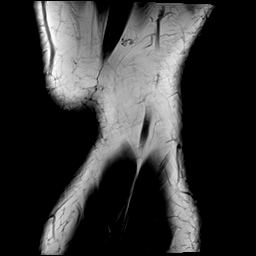
[im 10/28]
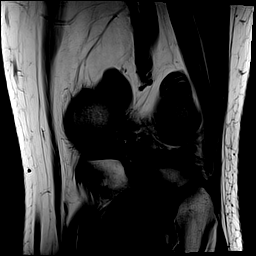
[im 19/28]
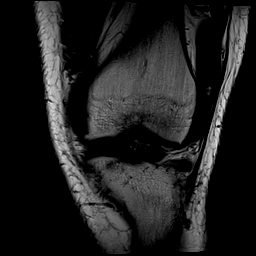
[im 28/28]
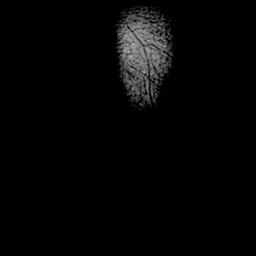

[Series 11: PD fat-sat · coronal · left · 4.0mm · 0.59mm/px · 4 of 28 slices shown (1 of 2)]
[im 1/28]
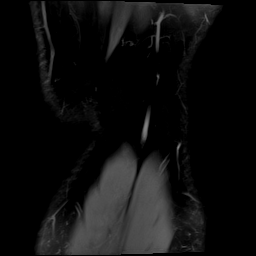
[im 10/28]
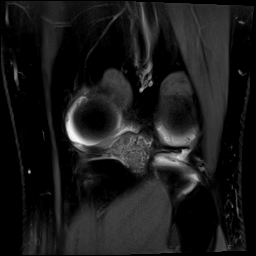
[im 19/28]
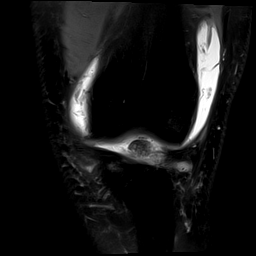
[im 28/28]
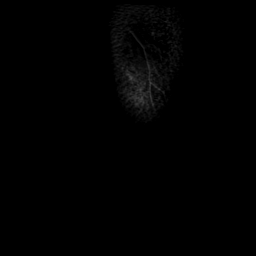

[Series 12: PD fat-sat · sagittal · left · 3.0mm · 0.59mm/px · 4 of 32 slices shown (2 of 2)]
[im 1/32]
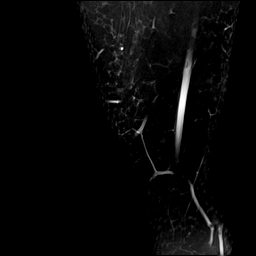
[im 11/32]
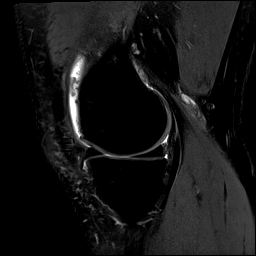
[im 21/32]
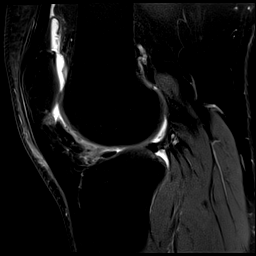
[im 32/32]
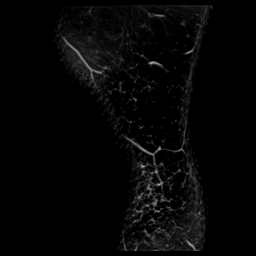

[Series 13: T2 fat-sat · sagittal · left · 3.0mm · 0.59mm/px · 4 of 32 slices shown (3 of 3)]
[im 1/32]
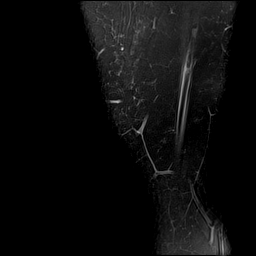
[im 11/32]
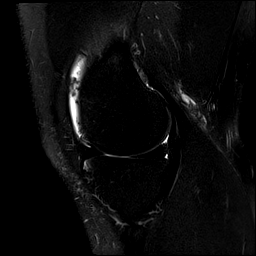
[im 21/32]
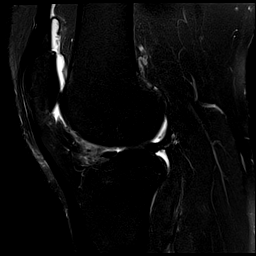
[im 32/32]
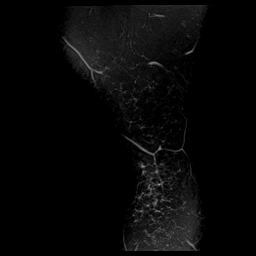

[Series 14: T1 fat-sat · axial · non-contrast · left · 4.0mm · 0.49mm/px · z∈[-59,+66]mm · 4 of 26 slices shown]
[im 1/26]
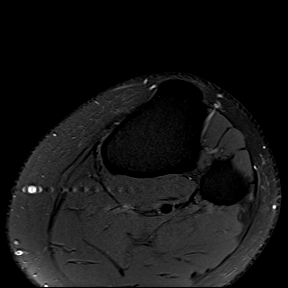
[im 9/26]
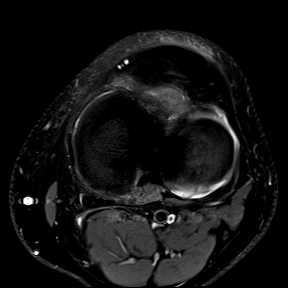
[im 17/26]
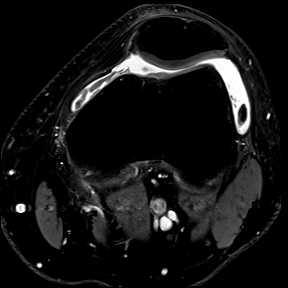
[im 26/26]
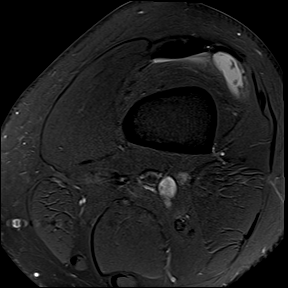

[Series 15: T1 fat-sat post-contrast · axial · left · 4.0mm · 0.49mm/px · z∈[-59,+66]mm · 4 of 26 slices shown (1 of 3)]
[im 1/26]
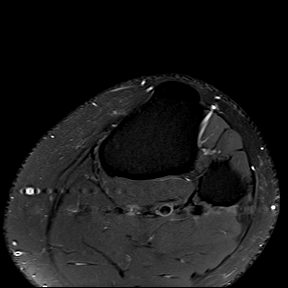
[im 9/26]
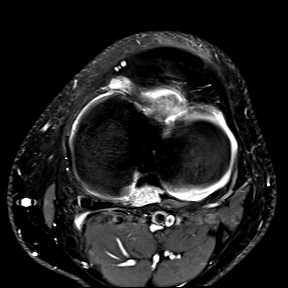
[im 17/26]
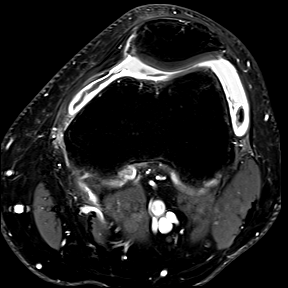
[im 26/26]
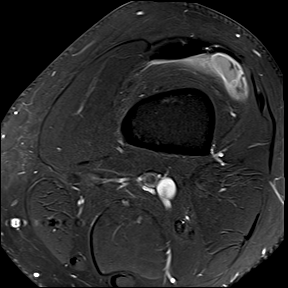

[Series 16: T1 fat-sat post-contrast · coronal · left · 4.0mm · 0.55mm/px · 4 of 28 slices shown (2 of 3)]
[im 1/28]
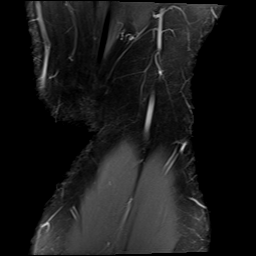
[im 10/28]
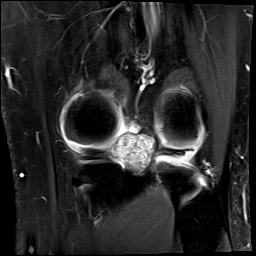
[im 19/28]
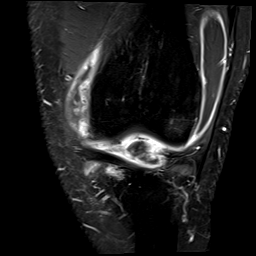
[im 28/28]
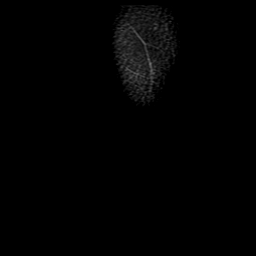

[Series 17: T1 fat-sat post-contrast · sagittal · left · 3.0mm · 0.55mm/px · 5 of 34 slices shown (3 of 3)]
[im 1/34]
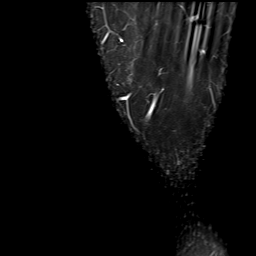
[im 9/34]
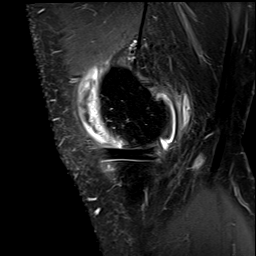
[im 17/34]
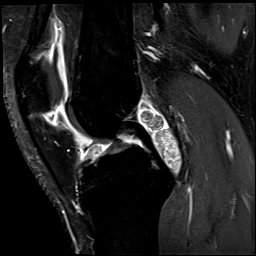
[im 25/34]
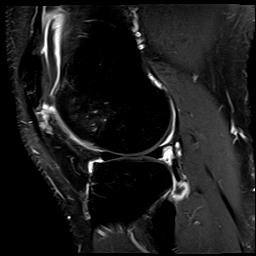
[im 34/34]
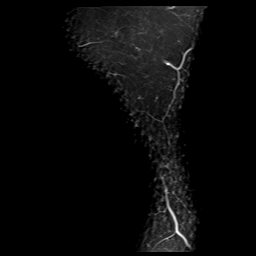

[40 of 40 positions shown; findings below may reference images not displayed]

FINDINGS: MENISCI

Medial meniscus:  Intact.

Lateral meniscus:  Intact.

LIGAMENTS

Cruciates:  Intact.

Collaterals:  Intact.

CARTILAGE

Patellofemoral: Cartilage thinning is seen along the inferior aspect
of the lateral femoral trochlea.

Medial:  Mildly degenerated without focal defect.

Lateral: Mildly degenerated without focal defect.

Joint: The patient has a small joint effusion. Multiple lobulated
mass lesions are identified within the joint such as a lesion
anterior to the ACL attachment to the tibia which measures 1.3 cm AP
x 1.1 cm craniocaudal by 1.6 cm transverse. A lesion posterior to
the PCL measures 2.4 cm craniocaudal by 1 cm AP by 2.5 cm
transverse. The lesions are a mixed signal intensity on T2 weighted
imaging but predominantly hypointense. There also mixed signal
intensity on T1 weighted imaging but they have fairly extensive T1
hyperintensity within them. The lesions demonstrate intermediate
postcontrast enhancement.

Popliteal Fossa:  No Baker's cyst.

Extensor Mechanism:  Intact.

Bones: No fracture or worrisome lesion. Small erosion in the
anterior tibia adjacent to a soft tissue lesion is noted. Small
subchondral cyst and mild edema are seen subjacent to cartilage
thinning of the lateral femoral trochlea.

Other: None.

Ligaments

Intact.
IMPRESSION: Multiple intra-articular lesions as described above. Differential
considerations are synovial chondromatosis versus pigmented
villonodular synovitis. No evidence of aggressive neoplasm is
present.

Negative for meniscal or ligament tear.

Mild degenerative disease about the knee.

## 2020-05-17 ENCOUNTER — Other Ambulatory Visit: Payer: Self-pay

## 2020-05-17 ENCOUNTER — Encounter: Payer: Self-pay | Admitting: Urology

## 2020-05-17 ENCOUNTER — Ambulatory Visit (INDEPENDENT_AMBULATORY_CARE_PROVIDER_SITE_OTHER): Payer: BC Managed Care – PPO | Admitting: Urology

## 2020-05-17 VITALS — BP 139/90 | HR 91

## 2020-05-17 DIAGNOSIS — R3129 Other microscopic hematuria: Secondary | ICD-10-CM

## 2020-05-17 NOTE — Progress Notes (Signed)
05/17/2020 8:47 AM   Mark Rollins 25-Apr-1981 [id number]  Referring provider: [name], MD [address] [facility] [place],  [place] [address]  No chief complaint on file.   HPI: Dr Bud:The patient is a 39 year old gentleman with a past medical history of an overactive bladder who is onMyrbetriq25 mg daily presents today for annual follow-up. Prior to starting thistherapy approximately one year ago, he was having severe urgency and frequency. He was also having nocturia 2. The symptoms completely resolvewiththis medication. He ran out 6 months ago because his insurance will not cover it.He then did excellent on Vesicare 5 mg but the insurance stopped paying for it.   The patient if he drinks fluid can void every 90 minutes to 2 hours get up twice at night. When he takes the medications above he does not get up at all and goes less frequently.  The patient was given a prescription of Detrol LA and oxybutynin ER 4 mg and 10 mg respectively. He will call his insurance companies to look for step coverage if they do not work. Otherwise I see him in 1 year. He asked me again about checking him for sexually transmitted disease but is asymptomatic. I felt from a cost conscious perspective this was not necessary. He did this last time as noted  Oxybutynin works great but he has drooling at night. His family doctor 5 to moderate fourth pill and it caused the drooling but also work. Also cause a lot of constipation. Is coming back on the oxybutynin. He said he was step therapy the company will cover his Myrbetriq which is wonderful.   The patient is back on oxybutynin.  It helps.  He still gets some drooling He was told on 2 or 3 urinalysis had a trace of blood in the urine.  He smokes 1 cigar a week.  No history of kidney stones or bladder surgery.  Each day he has mild burning at the beginning of urination.  Voids every 2-3 hours gets up once at night  He  said all 3 times it was definitely a trace.  He says the burning is intermittent for about the last 5 months and wears protection for intercourse  I reviewed the medical record and could not find any evidence of trace of blood or microscopic hematuria.   talked about prostatitis.  I will see him back in 6 weeks to recheck her urine for microscopic hematuria.  He understands he does not meet the definition and were not going to go through the work-up or cystoscopy as noted today and explained.  I called in Mobic 15 mg a day 30 tablets and 1 refill.  I called in ciprofloxacin 500 mg twice a day for 30 days.  He understands he could have low-grade prostatitis and/or inflammation of bulbar urethra.  Call with culture results.  He did note that ejaculation is not painful but just feels different  Today Frequency stable.  Last urine culture of urine and for sexually transmitted diseases negative. Burning at baseline was not that severe but he says it is a lot better.  It still burns a little bit at the beginning of urination.  Frequency stable.  Clinically not infected.    PMH: Past Medical History:  Diagnosis Date  . Arthritis    knee  . GERD (gastroesophageal reflux disease)   . Hyperlipemia   . Hypertension     Surgical History: Past Surgical History:  Procedure Laterality Date  .  KNEE ARTHROSCOPY Left 05/09/2019   Procedure: ARTHROSCOPY KNEE, COMPLETE SYNOVECTOMY, LOOSE BODY REMOVAL, CHONDROPLASTY;  Surgeon: Signa Kell, MD;  Location: [facility];  Service: Orthopedics;  Laterality: Left;  . TONSILLECTOMY      Home Medications:  Allergies as of 05/17/2020   No Known Allergies     Medication List       Accurate as of May 17, 2020  8:47 AM. If you have any questions, ask your nurse or doctor.        ciprofloxacin 500 MG tablet Commonly known as: CIPRO Take 1 tablet (500 mg total) by mouth 2 (two) times daily.   Fish Oil 1000 MG Caps Take 1 capsule by  mouth.   glucosamine-chondroitin 500-400 MG tablet Take 1 tablet by mouth 3 (three) times daily.   meloxicam 7.5 MG tablet Commonly known as: Mobic Take 1 tablet (7.5 mg total) by mouth daily.   multivitamin capsule Take 1 capsule by mouth daily.   oxybutynin 10 MG 24 hr tablet Commonly known as: DITROPAN-XL Take 1 tablet (10 mg total) by mouth daily.   rosuvastatin 10 MG tablet Commonly known as: CRESTOR Take 10 mg by mouth daily.   venlafaxine XR 75 MG 24 hr capsule Commonly known as: EFFEXOR-XR Take by mouth.       Allergies: No Known Allergies  Family History: Family History  Problem Relation Age of Onset  . Prostate cancer Neg Hx   . Kidney cancer Neg Hx   . Bladder Cancer Neg Hx     Social History:  reports that he has been smoking cigars. He has never used smokeless tobacco. He reports current alcohol use of about 3.0 standard drinks of alcohol per week. He reports that he does not use drugs.  ROS:                                        Physical Exam: There were no vitals taken for this visit.   Laboratory Data: Lab Results  Component Value Date   WBC 8.0 09/15/2013   HGB 14.9 09/15/2013   HCT 44.9 09/15/2013   MCV 86 09/15/2013   PLT 285 09/15/2013    Lab Results  Component Value Date   CREATININE 0.97 09/15/2013    No results found for: PSA  No results found for: TESTOSTERONE  No results found for: HGBA1C  Urinalysis    Component Value Date/Time   COLORURINE YELLOW 09/15/2013 1330   APPEARANCEUR Clear 04/05/2020 0948   LABSPEC 1.010 09/15/2013 1330   PHURINE 6.5 09/15/2013 1330   GLUCOSEU Negative 04/05/2020 0948   GLUCOSEU NEGATIVE 09/15/2013 1330   HGBUR 1+ 09/15/2013 1330   BILIRUBINUR Negative 04/05/2020 0948   BILIRUBINUR NEGATIVE 09/15/2013 1330   KETONESUR NEGATIVE 09/15/2013 1330   PROTEINUR Negative 04/05/2020 0948   PROTEINUR NEGATIVE 09/15/2013 1330   NITRITE Negative 04/05/2020 0948    NITRITE NEGATIVE 09/15/2013 1330   LEUKOCYTESUR Negative 04/05/2020 0948   LEUKOCYTESUR NEGATIVE 09/15/2013 1330    Pertinent Imaging:   Assessment & Plan: Patient today did have microscopic hematuria and urine sent for culture. Will have a CT scan the next week and I will call if abnormal. I will perform cystoscopy in 1 month and he will take meloxicam for 1 more month. He understands I rather hold off for a while if he does have some prostatitis or urethritis before cystoscopy.  There are  no diagnoses linked to this encounter.  No follow-ups on file.  Reece Packer, MD  [facility] [address] [place], [place] [address] [phone]

## 2020-05-17 NOTE — Patient Instructions (Signed)
Cystoscopy Cystoscopy is a procedure that is used to help diagnose and sometimes treat conditions that affect the lower urinary tract. The lower urinary tract includes the bladder and the urethra. The urethra is the tube that drains urine from the bladder. Cystoscopy is done using a thin, tube-shaped instrument with a light and camera at the end (cystoscope). The cystoscope may be hard or flexible, depending on the goal of the procedure. The cystoscope is inserted through the urethra, into the bladder. Cystoscopy may be recommended if you have:  Urinary tract infections that keep coming back.  Blood in the urine (hematuria).  An inability to control when you urinate (urinary incontinence) or an overactive bladder.  Unusual cells found in a urine sample.  A blockage in the urethra, such as a urinary stone.  Painful urination.  An abnormality in the bladder found during an intravenous pyelogram (IVP) or CT scan. Cystoscopy may also be done to remove a sample of tissue to be examined under a microscope (biopsy). What are the risks? Generally, this is a safe procedure. However, problems may occur, including:  Infection.  Bleeding.  What happens during the procedure?  1. You will be given one or more of the following: ? A medicine to numb the area (local anesthetic). 2. The area around the opening of your urethra will be cleaned. 3. The cystoscope will be passed through your urethra into your bladder. 4. Germ-free (sterile) fluid will flow through the cystoscope to fill your bladder. The fluid will stretch your bladder so that your health care provider can clearly examine your bladder walls. 5. Your doctor will look at the urethra and bladder. 6. The cystoscope will be removed The procedure may vary among health care providers  What can I expect after the procedure? After the procedure, it is common to have: 1. Some soreness or pain in your abdomen and urethra. 2. Urinary symptoms.  These include: ? Mild pain or burning when you urinate. Pain should stop within a few minutes after you urinate. This may last for up to 1 week. ? A small amount of blood in your urine for several days. ? Feeling like you need to urinate but producing only a small amount of urine. Follow these instructions at home: General instructions  Return to your normal activities as told by your health care provider.   Do not drive for 24 hours if you were given a sedative during your procedure.  Watch for any blood in your urine. If the amount of blood in your urine increases, call your health care provider.  If a tissue sample was removed for testing (biopsy) during your procedure, it is up to you to get your test results. Ask your health care provider, or the department that is doing the test, when your results will be ready.  Drink enough fluid to keep your urine pale yellow.  Keep all follow-up visits as told by your health care provider. This is important. Contact a health care provider if you:  Have pain that gets worse or does not get better with medicine, especially pain when you urinate.  Have trouble urinating.  Have more blood in your urine. Get help right away if you:  Have blood clots in your urine.  Have abdominal pain.  Have a fever or chills.  Are unable to urinate. Summary  Cystoscopy is a procedure that is used to help diagnose and sometimes treat conditions that affect the lower urinary tract.  Cystoscopy is done using   a thin, tube-shaped instrument with a light and camera at the end.  After the procedure, it is common to have some soreness or pain in your abdomen and urethra.  Watch for any blood in your urine. If the amount of blood in your urine increases, call your health care provider.  If you were prescribed an antibiotic medicine, take it as told by your health care provider. Do not stop taking the antibiotic even if you start to feel better. This  information is not intended to replace advice given to you by your health care provider. Make sure you discuss any questions you have with your health care provider. Document Revised: 04/30/2018 Document Reviewed: 04/30/2018 Elsevier Patient Education  2020 [employer]   

## 2020-05-18 LAB — URINALYSIS, COMPLETE
Bilirubin, UA: NEGATIVE
Glucose, UA: NEGATIVE
Ketones, UA: NEGATIVE
Leukocytes,UA: NEGATIVE
Nitrite, UA: NEGATIVE
Protein,UA: NEGATIVE
Specific Gravity, UA: >1.03 — ABNORMAL HIGH (ref 1.005–1.030)
Urobilinogen, Ur: 0.2 mg/dL (ref 0.2–1.0)
pH, UA: 5.5 (ref 5.0–7.5)

## 2020-05-18 LAB — MICROSCOPIC EXAMINATION
Bacteria, UA: NONE SEEN
Epithelial Cells (non renal): NONE SEEN /HPF (ref 0–10)

## 2020-05-22 LAB — CULTURE, URINE COMPREHENSIVE

## 2020-05-27 ENCOUNTER — Ambulatory Visit
Admission: RE | Admit: 2020-05-27 | Discharge: 2020-05-27 | Disposition: A | Discharge status: Home / Self Care | Payer: BC Managed Care – PPO | Source: Ambulatory Visit | Attending: Urology | Admitting: Urology

## 2020-05-27 ENCOUNTER — Other Ambulatory Visit: Payer: Self-pay

## 2020-05-27 DIAGNOSIS — R3129 Other microscopic hematuria: Secondary | ICD-10-CM | POA: Insufficient documentation

## 2020-05-27 MED ORDER — IOHEXOL 300 MG/ML  SOLN
150.0000 mL | Freq: Once | INTRAMUSCULAR | Status: AC | PRN
  Administered 2020-05-27: 150 mL via INTRAVENOUS

## 2020-06-21 ENCOUNTER — Other Ambulatory Visit: Payer: Self-pay | Admitting: Urology

## 2020-07-12 ENCOUNTER — Encounter: Payer: Self-pay | Admitting: Urology

## 2020-07-12 ENCOUNTER — Other Ambulatory Visit: Payer: Self-pay

## 2020-07-12 ENCOUNTER — Ambulatory Visit (INDEPENDENT_AMBULATORY_CARE_PROVIDER_SITE_OTHER): Payer: BC Managed Care – PPO | Admitting: Urology

## 2020-07-12 VITALS — BP 161/85 | HR 93 | Ht 71.0 in | Wt 286.0 lb

## 2020-07-12 DIAGNOSIS — R350 Frequency of micturition: Secondary | ICD-10-CM

## 2020-07-12 DIAGNOSIS — R3129 Other microscopic hematuria: Secondary | ICD-10-CM

## 2020-07-12 LAB — MICROSCOPIC EXAMINATION: Bacteria, UA: NONE SEEN

## 2020-07-12 LAB — URINALYSIS, COMPLETE
Bilirubin, UA: NEGATIVE
Glucose, UA: NEGATIVE
Ketones, UA: NEGATIVE
Leukocytes,UA: NEGATIVE
Nitrite, UA: NEGATIVE
Protein,UA: NEGATIVE
Specific Gravity, UA: 1.025 (ref 1.005–1.030)
Urobilinogen, Ur: 0.2 mg/dL (ref 0.2–1.0)
pH, UA: 6 (ref 5.0–7.5)

## 2020-07-12 MED ORDER — LIDOCAINE HCL 2 % IJ SOLN: 50.0000 mL | Freq: Once | INTRAMUSCULAR | Status: DC

## 2020-07-12 NOTE — Progress Notes (Signed)
07/12/2020 9:06 AM   Mark Rollins 1981/04/01 [id number]  Referring provider: [name], MD [address] [facility] [place],  [place] [address]  No chief complaint on file.   HPI: Dr Bud:The patient is a 40 year old gentleman with a past medical history of an overactive bladder who is onMyrbetriq25 mg daily presents today for annual follow-up.He then did excellent on Vesicare 5 mg but the insurance stopped paying for it.   The patient if he drinks fluid can void every 90 minutes to 2 hours get up twice at night. When he takes the medications above he does not get up at all and goes less frequently.  The patient was given a prescription of Detrol LA and oxybutynin ER 4 mg and 10 mg respectively. He will call his insurance companies to look for step coverage if they do not work. Otherwise I see him in 1 year. He asked me again about checking him for sexually transmitted disease but is asymptomatic. I felt from a cost conscious perspective this was not necessary. He did this last time as noted  Oxybutynin works great but he has drooling at night. His family doctor 5 to moderate fourth pill and it caused the drooling but also work. Also cause a lot of constipation. Is coming back on the oxybutynin. He said he was step therapy the company will cover his Myrbetriq   The patient is back on oxybutynin. It helps. He still gets some drooling He was told on 2 or 3 urinalysis had a trace of blood in the urine. He smokes 1 cigar a week. Each day he has mild burning at the beginning of urination. Voids every 2-3 hours gets up once at night  He said all 3 times it was definitely a trace. He says the burning is intermittent for about the last 5 months and wears protection for intercourse  talked about prostatitis. I will see him back in 6 weeks to recheck her urine for microscopic hematuria. He understands he does not meet the definition and were not going to  go through the work-up or cystoscopy as noted today and explained. I called in Mobic 15 mg a day 30 tablets and 1 refill. I called in ciprofloxacin 500 mg twice a day for 30 days. He understands he could have low-grade prostatitis and/or inflammation of bulbar urethra. Call with culture results. He did note that ejaculation is not painful but just feels different  Patient today did have microscopic hematuria and urine sent for culture. Will have a CT scan the next week and I will call if abnormal. I will perform cystoscopy in 1 month and he will take meloxicam for 1 more month. He understands I rather hold off for a while if he does have some prostatitis or urethritis before cystoscopy.  Today Frequency stable.  CT scan normal.  Last culture negative Patient is still on oxybutynin.  Still has a little bit of intermittent mild burning.  Still has some drooling. Patient underwent flexible cystoscopy.  Penile bulbar urethra normal.  Prostatic urethra normal.  He was quite tender within the prostatic urethra but was an excellent patient.  I found it difficult to rotate the telescope in the usual fashion and I think he was clamping down around it.  He had an element of an elevated bladder neck.  Bladder mucosa and trigone were normal.  No carcinoma. I carefully looked around the bladder twice. the procedure was uncomfortable but he was an excellent patient.  PMH: Past Medical History:  Diagnosis Date  . Arthritis    knee  . GERD (gastroesophageal reflux disease)   . Hyperlipemia   . Hypertension     Surgical History: Past Surgical History:  Procedure Laterality Date  . KNEE ARTHROSCOPY Left 05/09/2019   Procedure: ARTHROSCOPY KNEE, COMPLETE SYNOVECTOMY, LOOSE BODY REMOVAL, CHONDROPLASTY;  Surgeon: Signa Kell, MD;  Location: [facility];  Service: Orthopedics;  Laterality: Left;  . TONSILLECTOMY      Home Medications:  Allergies as of 07/12/2020      Reactions   Melatonin     Other reaction(s): Headache      Medication List       Accurate as of July 12, 2020  9:06 AM. If you have any questions, ask your nurse or doctor.        Fish Oil 1000 MG Caps Take 1 capsule by mouth.   glucosamine-chondroitin 500-400 MG tablet Take 1 tablet by mouth 3 (three) times daily.   multivitamin capsule Take 1 capsule by mouth daily.   oxybutynin 10 MG 24 hr tablet Commonly known as: DITROPAN-XL Take 1 tablet (10 mg total) by mouth daily.   rosuvastatin 10 MG tablet Commonly known as: CRESTOR Take 10 mg by mouth daily.   venlafaxine XR 75 MG 24 hr capsule Commonly known as: EFFEXOR-XR Take by mouth.       Allergies:  Allergies  Allergen Reactions  . Melatonin     Other reaction(s): Headache    Family History: Family History  Problem Relation Age of Onset  . Prostate cancer Neg Hx   . Kidney cancer Neg Hx   . Bladder Cancer Neg Hx     Social History:  reports that he has been smoking cigars. He has never used smokeless tobacco. He reports current alcohol use of about 3.0 standard drinks of alcohol per week. He reports that he does not use drugs.  ROS:                                        Physical Exam: There were no vitals taken for this visit.  Constitutional:  Alert and oriented, No acute distress.   Laboratory Data: Lab Results  Component Value Date   WBC 8.0 09/15/2013   HGB 14.9 09/15/2013   HCT 44.9 09/15/2013   MCV 86 09/15/2013   PLT 285 09/15/2013    Lab Results  Component Value Date   CREATININE 0.97 09/15/2013    No results found for: PSA  No results found for: TESTOSTERONE  No results found for: HGBA1C  Urinalysis    Component Value Date/Time   COLORURINE YELLOW 09/15/2013 1330   APPEARANCEUR Clear 05/17/2020 0942   LABSPEC 1.010 09/15/2013 1330   PHURINE 6.5 09/15/2013 1330   GLUCOSEU Negative 05/17/2020 0942   GLUCOSEU NEGATIVE 09/15/2013 1330   HGBUR 1+ 09/15/2013 1330    BILIRUBINUR Negative 05/17/2020 0942   BILIRUBINUR NEGATIVE 09/15/2013 1330   KETONESUR NEGATIVE 09/15/2013 1330   PROTEINUR Negative 05/17/2020 0942   PROTEINUR NEGATIVE 09/15/2013 1330   NITRITE Negative 05/17/2020 0942   NITRITE NEGATIVE 09/15/2013 1330   LEUKOCYTESUR Negative 05/17/2020 0942   LEUKOCYTESUR NEGATIVE 09/15/2013 1330    Pertinent Imaging:   Assessment & Plan: Patient has been cleared for microscopic hematuria.  I think he was tender due to prostatitis.  Continue with oxybutynin for his frequency.  Reassess in 6  months.  Take Advil as needed for burning.  He does not think the Mobic helps a great deal.  1. Microscopic hematuria  - Urinalysis, Complete   No follow-ups on file.  Martina Sinner, MD  [facility] [address] [place], [place] [address] [phone]

## 2021-01-10 ENCOUNTER — Ambulatory Visit: Payer: Self-pay | Admitting: Urology

## 2021-01-11 ENCOUNTER — Encounter: Payer: Self-pay | Admitting: Urology

## 2021-01-20 ENCOUNTER — Other Ambulatory Visit: Payer: Self-pay | Admitting: Orthopedic Surgery

## 2021-01-20 DIAGNOSIS — M25562 Pain in left knee: Secondary | ICD-10-CM

## 2021-06-06 ENCOUNTER — Other Ambulatory Visit: Payer: Self-pay | Admitting: Orthopedic Surgery

## 2021-09-13 ENCOUNTER — Other Ambulatory Visit: Payer: Self-pay | Admitting: Orthopedic Surgery

## 2021-12-30 ENCOUNTER — Encounter: Admission: RE | Payer: Self-pay | Source: Home / Self Care

## 2021-12-30 ENCOUNTER — Ambulatory Visit
Admission: RE | Admit: 2021-12-30 | Payer: BC Managed Care – PPO | Source: Home / Self Care | Admitting: Orthopedic Surgery

## 2021-12-30 SURGERY — ARTHROSCOPY, KNEE
Anesthesia: Choice | Site: Knee | Laterality: Left
